# Patient Record
Sex: Male | Born: 1935 | Race: White | Hispanic: No | Smoking: Former smoker
Health system: Southern US, Community
[De-identification: ages and names within clinical notes are randomized; demographics above are authoritative.]

## PROBLEM LIST (undated history)

## (undated) DIAGNOSIS — R001 Bradycardia, unspecified: Secondary | ICD-10-CM

## (undated) DIAGNOSIS — G629 Polyneuropathy, unspecified: Secondary | ICD-10-CM

## (undated) DIAGNOSIS — I10 Essential (primary) hypertension: Secondary | ICD-10-CM

## (undated) DIAGNOSIS — E785 Hyperlipidemia, unspecified: Secondary | ICD-10-CM

## (undated) DIAGNOSIS — S065X9A Traumatic subdural hemorrhage with loss of consciousness of unspecified duration, initial encounter: Secondary | ICD-10-CM

## (undated) DIAGNOSIS — I509 Heart failure, unspecified: Secondary | ICD-10-CM

## (undated) DIAGNOSIS — I4821 Permanent atrial fibrillation: Secondary | ICD-10-CM

## (undated) DIAGNOSIS — S065XAA Traumatic subdural hemorrhage with loss of consciousness status unknown, initial encounter: Secondary | ICD-10-CM

## (undated) HISTORY — DX: Essential (primary) hypertension: I10

## (undated) HISTORY — PX: CRANIOTOMY: SHX93

## (undated) HISTORY — DX: Hyperlipidemia, unspecified: E78.5

## (undated) HISTORY — DX: Heart failure, unspecified: I50.9

## (undated) HISTORY — PX: CHOLECYSTECTOMY: SHX55

## (undated) HISTORY — DX: Traumatic subdural hemorrhage with loss of consciousness of unspecified duration, initial encounter: S06.5X9A

## (undated) HISTORY — DX: Bradycardia, unspecified: R00.1

## (undated) HISTORY — DX: Permanent atrial fibrillation: I48.21

## (undated) HISTORY — DX: Polyneuropathy, unspecified: G62.9

## (undated) HISTORY — DX: Traumatic subdural hemorrhage with loss of consciousness status unknown, initial encounter: S06.5XAA

---

## 2001-07-11 ENCOUNTER — Encounter: Payer: Self-pay | Admitting: Neurosurgery

## 2001-07-12 ENCOUNTER — Inpatient Hospital Stay (HOSPITAL_COMMUNITY): Admission: RE | Admit: 2001-07-12 | Discharge: 2001-07-14 | Payer: Self-pay | Admitting: Neurosurgery

## 2001-07-21 ENCOUNTER — Encounter: Admission: RE | Admit: 2001-07-21 | Discharge: 2001-07-21 | Payer: Self-pay | Admitting: Neurosurgery

## 2001-07-21 ENCOUNTER — Encounter: Payer: Self-pay | Admitting: Neurosurgery

## 2003-02-28 ENCOUNTER — Other Ambulatory Visit: Admission: RE | Admit: 2003-02-28 | Discharge: 2003-02-28 | Payer: Self-pay | Admitting: Dermatology

## 2004-09-01 ENCOUNTER — Ambulatory Visit: Payer: Self-pay | Admitting: Cardiology

## 2004-09-24 ENCOUNTER — Ambulatory Visit: Payer: Self-pay | Admitting: Cardiology

## 2004-09-29 ENCOUNTER — Ambulatory Visit: Payer: Self-pay | Admitting: Cardiology

## 2005-07-06 ENCOUNTER — Encounter: Payer: Self-pay | Admitting: Cardiology

## 2005-07-06 ENCOUNTER — Ambulatory Visit: Payer: Self-pay | Admitting: Cardiology

## 2005-07-07 ENCOUNTER — Encounter: Payer: Self-pay | Admitting: Cardiology

## 2005-07-21 ENCOUNTER — Ambulatory Visit: Payer: Self-pay | Admitting: Cardiology

## 2005-12-24 ENCOUNTER — Other Ambulatory Visit: Admission: RE | Admit: 2005-12-24 | Discharge: 2005-12-24 | Payer: Self-pay | Admitting: Dermatology

## 2007-03-30 ENCOUNTER — Ambulatory Visit (HOSPITAL_COMMUNITY): Admission: RE | Admit: 2007-03-30 | Discharge: 2007-03-30 | Payer: Self-pay | Admitting: Ophthalmology

## 2008-03-05 ENCOUNTER — Encounter: Payer: Self-pay | Admitting: Cardiology

## 2009-06-17 ENCOUNTER — Encounter: Payer: Self-pay | Admitting: Cardiology

## 2009-07-11 ENCOUNTER — Encounter: Payer: Self-pay | Admitting: Cardiology

## 2009-07-26 ENCOUNTER — Encounter: Payer: Self-pay | Admitting: Cardiology

## 2009-07-28 ENCOUNTER — Encounter: Payer: Self-pay | Admitting: Cardiology

## 2009-07-29 ENCOUNTER — Encounter: Payer: Self-pay | Admitting: Cardiology

## 2009-08-13 ENCOUNTER — Encounter: Payer: Self-pay | Admitting: Cardiology

## 2009-08-19 ENCOUNTER — Encounter: Payer: Self-pay | Admitting: Cardiology

## 2009-10-24 DIAGNOSIS — I498 Other specified cardiac arrhythmias: Secondary | ICD-10-CM

## 2009-10-24 DIAGNOSIS — E871 Hypo-osmolality and hyponatremia: Secondary | ICD-10-CM | POA: Insufficient documentation

## 2009-10-24 DIAGNOSIS — R55 Syncope and collapse: Secondary | ICD-10-CM

## 2009-10-24 DIAGNOSIS — I10 Essential (primary) hypertension: Secondary | ICD-10-CM | POA: Insufficient documentation

## 2009-10-24 DIAGNOSIS — Z8673 Personal history of transient ischemic attack (TIA), and cerebral infarction without residual deficits: Secondary | ICD-10-CM

## 2009-10-24 DIAGNOSIS — E785 Hyperlipidemia, unspecified: Secondary | ICD-10-CM

## 2009-10-24 DIAGNOSIS — E876 Hypokalemia: Secondary | ICD-10-CM | POA: Insufficient documentation

## 2009-10-24 DIAGNOSIS — I4891 Unspecified atrial fibrillation: Secondary | ICD-10-CM

## 2009-10-28 ENCOUNTER — Ambulatory Visit: Payer: Self-pay | Admitting: Cardiology

## 2009-10-28 DIAGNOSIS — R9430 Abnormal result of cardiovascular function study, unspecified: Secondary | ICD-10-CM | POA: Insufficient documentation

## 2009-11-04 ENCOUNTER — Encounter: Payer: Self-pay | Admitting: Cardiology

## 2009-11-04 ENCOUNTER — Encounter: Payer: Self-pay | Admitting: Physician Assistant

## 2009-11-13 ENCOUNTER — Encounter (INDEPENDENT_AMBULATORY_CARE_PROVIDER_SITE_OTHER): Payer: Self-pay | Admitting: *Deleted

## 2009-12-16 ENCOUNTER — Telehealth (INDEPENDENT_AMBULATORY_CARE_PROVIDER_SITE_OTHER): Payer: Self-pay | Admitting: *Deleted

## 2010-01-13 ENCOUNTER — Ambulatory Visit: Payer: Self-pay | Admitting: Cardiology

## 2010-01-13 ENCOUNTER — Encounter: Payer: Self-pay | Admitting: Cardiology

## 2010-01-28 ENCOUNTER — Ambulatory Visit: Payer: Self-pay | Admitting: Cardiology

## 2010-07-26 HISTORY — PX: PACEMAKER INSERTION: SHX728

## 2010-08-04 ENCOUNTER — Encounter: Payer: Self-pay | Admitting: Physician Assistant

## 2010-08-05 ENCOUNTER — Encounter: Payer: Self-pay | Admitting: Physician Assistant

## 2010-08-08 ENCOUNTER — Ambulatory Visit: Payer: Self-pay | Admitting: Physician Assistant

## 2010-08-08 ENCOUNTER — Encounter (INDEPENDENT_AMBULATORY_CARE_PROVIDER_SITE_OTHER): Payer: Self-pay | Admitting: *Deleted

## 2010-08-08 DIAGNOSIS — I080 Rheumatic disorders of both mitral and aortic valves: Secondary | ICD-10-CM | POA: Insufficient documentation

## 2010-08-08 DIAGNOSIS — Z8669 Personal history of other diseases of the nervous system and sense organs: Secondary | ICD-10-CM

## 2010-08-11 ENCOUNTER — Inpatient Hospital Stay (HOSPITAL_COMMUNITY): Admission: RE | Admit: 2010-08-11 | Discharge: 2010-08-12 | Payer: Self-pay | Admitting: Internal Medicine

## 2010-08-11 ENCOUNTER — Ambulatory Visit: Payer: Self-pay | Admitting: Internal Medicine

## 2010-08-12 ENCOUNTER — Telehealth (INDEPENDENT_AMBULATORY_CARE_PROVIDER_SITE_OTHER): Payer: Self-pay | Admitting: *Deleted

## 2010-08-13 ENCOUNTER — Encounter: Payer: Self-pay | Admitting: Internal Medicine

## 2010-08-21 ENCOUNTER — Ambulatory Visit: Payer: Self-pay

## 2010-08-21 ENCOUNTER — Encounter: Payer: Self-pay | Admitting: Internal Medicine

## 2010-11-25 NOTE — Assessment & Plan Note (Signed)
Summary: 3 mo fu per april reminder-srs   Visit Type:  Follow-up Primary Provider:  Kirstie Peri  CC:  atrial fibrillation.  History of Present Illness: The patient presents for followup of atrial fibrillation.  At the last visit I ordered an echocardiogram to evaluate the suggestion from a previous echo of a reduced ejection fraction of 40%. Our echo demonstrated some moderate pulmonary hypertension but well preserved chamber sizes and ventricular function with abnormal left ventricular EF. A Holter demonstrated persistent atrial fibrillation with a controlled rate and occasional PVC or short runs of nonsustained V. tach. There was one pause of 2.8 seconds but no sustained bradycardia arrhythmias and no symptoms.  Preventive Screening-Counseling & Management  Alcohol-Tobacco     Smoking Status: quit  Comments: Pt states for about 30 yrs he smoked only socially-when playing gold, party, etc.  Current Medications (verified): 1)  Furosemide 40 Mg Tabs (Furosemide) .... Take 1 Tablet By Mouth Once A Day 2)  Neurontin 100 Mg Caps (Gabapentin) .... Take 1 Tablet By Mouth Three Times A Day 3)  Diovan Hct 320-25 Mg Tabs (Valsartan-Hydrochlorothiazide) .... Take 1 Tablet By Mouth Once A Day 4)  Potassium Chloride Crys Cr 20 Meq Cr-Tabs (Potassium Chloride Crys Cr) .... Take 1 Tablet By Mouth Once A Day. Pt States He Takes About 2-3 Times Per Week. 5)  Aspirin Ec 325 Mg Tbec (Aspirin) .... Take 1/2 Tablet By Mouth Daily 6)  Flomax 0.4 Mg Caps (Tamsulosin Hcl) .... Take 1 Capsule By Mouth Once A Day  Allergies: No Known Drug Allergies  Comments:  Nurse/Medical Assistant: The patient's medications were reviewed with the patient and were updated in the Medication List. Pt verbally confirmed medications.  Cyril Loosen, RN, BSN (January 28, 2010 9:55 AM)  Past History:  Past Medical History: Reviewed history from 10/28/2009 and no changes required. Hypertension Permanent atrial  fibrillation Hyperlipidemia Hypoptassemia Subdural Hematoma CHF Peripheral Neuropathy  Past Surgical History: Reviewed history from 10/28/2009 and no changes required. Craniotomy Cholecystectomy  Social History: Smoking Status:  quit  Review of Systems       As stated in the HPI and negative for all other systems.   Vital Signs:  Patient profile:   77 year old male Height:      69 inches Weight:      193 pounds Pulse rate:   63 / minute BP sitting:   142 / 88  (left arm) Cuff size:   regular  Vitals Entered By: Cyril Loosen, RN, BSN (January 28, 2010 9:52 AM) CC: atrial fibrillation Comments Follow up visit. No cardiac complaints   Physical Exam  General:  Well developed, well nourished, in no acute distress. Neck:  Neck supple, no JVD. No masses, thyromegaly or abnormal cervical nodes. Lungs:  Clear bilaterally to auscultation and percussion. Heart:  Non-displaced PMI, chest non-tender; irregular rate and rhythm, S1, S2 without murmurs, rubs or gallops. Carotid upstroke normal, no bruit. Normal abdominal aortic size, no bruits. Femorals normal pulses, no bruits. Pedals normal pulses. No edema, no varicosities. Abdomen:  Bowel sounds positive; abdomen soft and non-tender without masses, organomegaly, or hernias noted. No hepatosplenomegaly, obese Msk:  Back normal, normal gait. Muscle strength and tone normal. Extremities:  No clubbing or cyanosis. Neurologic:  Alert and oriented x 3. Psych:  Normal affect.   Impression & Recommendations:  Problem # 1:  ATRIAL FIBRILLATION (ICD-427.31) He tolerates this rhythm. He has rate control. As discussed extensively in the previous note he is not  a Coumadin candidate. He accepts the risk of thromboembolic events and atrial fibrillation. His updated medication list for this problem includes:    Aspirin Ec 325 Mg Tbec (Aspirin) .Marland Kitchen... Take 1/2 tablet by mouth daily  Problem # 2:  HYPERTENSION (ICD-401.9) His blood pressure  is very slightly elevated. This can be followed by his primary M.D. He should have weight loss as the next therapeutic step in achieving target blood pressures.  Problem # 3:  CARDIOVASCULAR FUNCTION STUDY, ABNORMAL (ICD-794.30) His EF was normal. He does have elelvated pulmonary pressures but no symptoms or signs associated with this.  No further therapy or evaluation is planned.  Patient Instructions: 1)  Your physician recommends that you continue on your current medications as directed. Please refer to the Current Medication list given to you today. 2)  Your physician wants you to follow-up in: 18 months. You will receive a reminder letter in the mail two months in advance. If you don't receive a letter, please call our office to schedule the follow-up appointment.

## 2010-11-25 NOTE — Progress Notes (Signed)
Summary: echo?  Phone Note Call from Patient Call back at Work Phone [phone]   Caller: patient walked in Reason for Call: Talk to Nurse Summary of Call: patient is thinking that he is to have an echo scheduled.  He stated he has been waiting on [place] to contact him to schedule.  best number to reach him is work Initial call taken by: Claudette Laws,  December 16, 2009 3:32 PM  Follow-up for Phone Call        left message with Sallye Ober to have patient call our office. Follow-up by: Carlye Grippe,  December 18, 2009 10:28 AM  Additional Follow-up for Phone Call Additional follow up Details #1::        spoke with patient and informed him of his upcoming appt on April 5th and that his echo is due at that time also and he would get a call from [place] informing him of that information. Additional Follow-up by: Carlye Grippe,  December 19, 2009 2:20 PM

## 2010-11-25 NOTE — Miscellaneous (Signed)
Summary: Device preload  Clinical Lists Changes  Observations: Added new observation of PPM INDICATN: Brady A-fib (08/13/2010 14:52) Added new observation of MAGNET RTE: BOL 100 ERI 85 (08/13/2010 14:52) Added new observation of PPMLEADSTAT1: active (08/13/2010 14:52) Added new observation of PPMLEADSER1: [id number] (08/13/2010 14:52) Added new observation of PPMLEADMOD1: 1946  (08/13/2010 14:52) Added new observation of PPMLEADLOC1: RV  (08/13/2010 14:52) Added new observation of PPM IMP MD: Hillis Range, MD  (08/13/2010 14:52) Added new observation of PPMLEADDOI1: 08/11/2010  (08/13/2010 14:52) Added new observation of PPM DOI: 08/11/2010  (08/13/2010 14:52) Added new observation of PPM SERL#: [id number]  (08/13/2010 14:52) Added new observation of PPM MODL#: [id number]  (08/13/2010 56:21) Added new observation of PACEMAKERMFG: [employer]  (08/13/2010 14:52) Added new observation of PACEMAKER MD: Hillis Range, MD  (08/13/2010 14:52)      PPM Specifications Following MD:  Hillis Range, MD     PPM Vendor:  [employer] Jude     PPM Model Number:  [id number]     PPM Serial Number:  [id number] PPM DOI:  08/11/2010     PPM Implanting MD:  Hillis Range, MD  Lead 1    Location: RV     DOI: 08/11/2010     Model #: 1946     Serial #: [id number]     Status: active  Magnet Response Rate:  BOL 100 ERI 85  Indications:  Edwin [name]

## 2010-11-25 NOTE — Progress Notes (Signed)
Summary: Fax machine Down  ---- Converted from flag ---- ---- 08/12/2010 10:02 AM, Fax Status wrote: Called phone number ringing but no answer.  Transmit Time: 08/12/2010 10:00AM.  Details: Office Visit : rov-per shah/gene possible device needed -Document ID: [id number]-.  Fax ID: [id number]. ------------------------------  Per Dennie Bible at [facility], fax machine is down and has been for a while. She is aware we are unable to fax office notes d/t their fax being down.

## 2010-11-25 NOTE — Procedures (Signed)
Summary: Holter and Event/ CARDIONET DR. SHAH  Holter and Event/ [facility]   Imported By: Dorise Hiss 08/08/2010 10:23:36  _____________________________________________________________________  External Attachment:    Type:   Image     Comment:   External Document

## 2010-11-25 NOTE — Letter (Signed)
Summary: Implantable Device Instructions  Architectural technologist at [facility]. [address]   [place], [place] [address]   Phone: [phone]  Fax: [phone]      Implantable Device Instructions  You are scheduled for:  __X___ Permanent Transvenous Pacemaker _____ Implantable Cardioverter Defibrillator _____ Implantable Loop Recorder _____ Generator Change  on _Monday, October 17, 2011____ with Dr. __Allred___.  1.  Please arrive at the Short Stay Center at [facility] at _10:30am____ on the day of your procedure.  2.  Do not eat or drink the night before your procedure.  3.  Complete lab work on _____.  The lab at [facility] is open from 8:30 AM to 1:30 PM and from 2:30 PM to 5:00 PM.  The lab at [facility] is open from 7:30 AM to 5:30 PM.  You do not have to be fasting.  4.  Do NOT take these medications for ____ days prior to your procedure:  _________________________.  Take your last dose of Coumadin on ________.  5.  Plan for an overnight stay.  Bring your insurance cards and a list of your medications.  6.  Wash your chest and neck with antibacterial soap (any brand) the evening before and the morning of your procedure.  Rinse well.  7.  Education material received:     Pacemaker _____           ICD _____           Arrhythmia _____  *If you have ANY questions after you get home, please call the office [phone].  *Every attempt is made to prevent procedures from being rescheduled.  Due to the nauture of Electrophysiology, rescheduling can happen.  The physician is always aware and directs the staff when this occurs.

## 2010-11-25 NOTE — Progress Notes (Signed)
Summary: Fax not working  ---- [employer] from [employer] ---- ---- 08/12/2010 10:01 AM, Fax Status wrote: Called phone number ringing but no answer.  Transmit Time: 08/12/2010 9:59AM.  Details: Operative Report : NAME:  [name], Edwin             ACCOUNT NO.:  [id number] -Document ID: [id number]-.  Fax ID: [id number]. ------------------------------  Per Dennie Bible at [facility], fax machine is down and has been for a while. She is aware we are unable to fax office notes d/t their fax being down.

## 2010-11-25 NOTE — Procedures (Signed)
Summary: Holter Monitor  Holter Monitor   Imported By: Cyril Loosen, RN, BSN 11/13/2009 16:57:13  _____________________________________________________________________  External Attachment:    Type:   Image     Comment:   External Document

## 2010-11-25 NOTE — Assessment & Plan Note (Signed)
Summary: rov-per shah/gene possible device needed   Visit Type:  Follow-up Primary Provider:  Kirstie Peri  CC:  A.fib with low HR's.  History of Present Illness: patient is seen as an add-on today, per Dr. Margaretmary Eddy request, for evaluation of marked bradycardia.  Patient has been seen here in the recent past, by Dr. Antoine Poche, initially for evaluation of abnormal LV function, as well as atrial fibrillation. He has been previously deemed as being not a suitable candidate for Coumadin anticoagulation, given history of subdural hematomas. He has no known history of CAD, with a NL Adenosine Cardiolite: EF 59%, in 2006.  More recently, a repeat 2D echo in March 2011, per Dr. Antoine Poche, suggested NL LVF (EF 60-65%), with no focal wall motion abnormalities, mild MR, and moderate pulmonary HTN (RVSP 46 mmHg).  Patient reports new development of low pulse readings, in the 30 bpm range, with some associated weakness, but no near-syncope/syncope or falls. He states that he had previously had pulse readings in the 50s, "all my life".  Patient continues to exercise on a regular basis walking a mile a day, some 2-3 times a week.  Dr. Sherryll Burger initiated workup with a 2-D echo and 24-hour Holter monitor, earlier this week. Holter monitor yielded atrial fibrillation with rates ranging from 31-64 (mean 40 bpm). 2-D echo indicated normal LVEF (ES 60-65%) with mild/moderate mitral regurgitation.  Preventive Screening-Counseling & Management  Alcohol-Tobacco     Smoking Status: quit  Comments: Smoked socially about 15 yrs ago  Current Medications (verified): 1)  Furosemide 40 Mg Tabs (Furosemide) .... Take 1 1/2 Tablet By Mouth Once A Day 2)  Diovan Hct 320-25 Mg Tabs (Valsartan-Hydrochlorothiazide) .... Take 1 Tablet By Mouth Once A Day 3)  Potassium Chloride Crys Cr 20 Meq Cr-Tabs (Potassium Chloride Crys Cr) .... Take 1 Tablet By Mouth Once A Day. Pt States He Takes About 2-3 Times Per Week. 4)  Aspirin Ec 325  Mg Tbec (Aspirin) .... Take 1/2 Tablet By Mouth Daily 5)  Flomax 0.4 Mg Caps (Tamsulosin Hcl) .... Take 1 Capsule By Mouth Once A Day 6)  Gabapentin 300 Mg Caps (Gabapentin) .... Take 1 Capsule By Mouth Two Times A Day  Allergies: No Known Drug Allergies  Comments:  Nurse/Medical Assistant: The patient's medications were reviewed with the patient and were updated in the Medication List. Pt verbally confirmed medications.  Cyril Loosen, RN, BSN (August 08, 2010 2:50 PM)  Past History:  Past Medical History: Last updated: 10/28/2009 Hypertension Permanent atrial fibrillation Hyperlipidemia Hypoptassemia Subdural Hematoma CHF Peripheral Neuropathy  Review of Systems       No fevers, chills, hemoptysis, dysphagia, melena, hematocheezia, hematuria, rash, claudication, orthopnea, pnd, pedal edema. denies tachycardia palpitations, exertional chest discomfort, or significant exertional dyspnea. All other systems negative.   Vital Signs:  Patient profile:   78 year old male Height:      69 inches Weight:      197.25 pounds BMI:     29.23 Pulse rate:   42 / minute BP sitting:   161 / 87  (left arm) Cuff size:   regular  Vitals Entered By: Cyril Loosen, RN, BSN (August 08, 2010 2:45 PM) CC: A.fib with low HR's Comments Per Dr. Sherryll Burger for eval for possible PTVP   Physical Exam  Additional Exam:  GEN: 77 year old male, sitting upright, no distress HEENT: NCAT,PERRLA,EOMI NECK: palpable pulses, no bruits; no JVD; no TM LUNGS: CTA bilaterally HEART: irregularly irregular (S1S2); no significant murmurs; no rubs; no gallops ABD:  soft, NT; intact BS EXT: intact distal pulses; 1+ bilateral peripheral edema SKIN: warm, dry MUSC: no obvious deformity NEURO: A/O (x3)     EKG  Procedure date:  08/04/2010  Findings:      EKG from Dr. Alver Fisher office, 08/04/10: idio junctional rhythm 40 bpm  Impression & Recommendations:  Problem # 1:  ATRIAL FIBRILLATION  (ICD-427.31)  patient presents with symptomatic bradycardia, with previously documented atrial fibrillation, but now with marked bradycardia in the 30-40 bpm range. There is also evidence of a junctional escape rhythm at 40 bpm. He presents with complaint of weakness, but no syncope or falls. He has no documented history of CAD, with a normal nuclear imaging study in 2006. More recently, he had a 2-D echo indicating normal LVF. Patient has been previously deemed not a suitable candidate for Coumadin anticoagulation, given history of subdural hematoma. He is on aspirin. Recommendation, per Dr. Johney Frame, is to proceed with permanent pacemaker implantation, and patient has agreed to proceed. Risks/benefits of the procedure were discussed, and arrangements have been made to have this scheduled for Monday of next week. Labs will be drawn on the morning of the procedure.  Problem # 2:  HYPERTENSION (ICD-401.9) Assessment: Comment Only  His updated medication list for this problem includes:    Furosemide 40 Mg Tabs (Furosemide) .Marland Kitchen... Take 1 1/2 tablet by mouth once a day    Diovan Hct 320-25 Mg Tabs (Valsartan-hydrochlorothiazide) .Marland Kitchen... Take 1 tablet by mouth once a day    Aspirin Ec 325 Mg Tbec (Aspirin) .Marland Kitchen... Take 1/2 tablet by mouth daily  Problem # 3:  MITRAL REGURGITATION (ICD-396.3)  assessed as mild/moderate, by most recent studies. we'll continue to monitor clinically.  Problem # 4:  SUBDURAL HEMATOMA, HX OF (ICD-V12.49) Assessment: Comment Only  Appended Document: rov-per shah/gene possible device needed The patient has symptomatic bradycardia.  Given regularization of RR intervals, I am concerned about degeneration of the AV node.  His QRS remains narrow which is somewhat reassuring.  He denies presyncope or syncope and has not had prolonged pauses.  No reversible cause has been found.    I had a long discussion with the patient today about my concerns.  I have advised him to have a pacemaker  implanted at our next available time and to avoid driving in the interim.  He is instructed to call 911 immediately if he has symptoms of bradycardia including dizziness or presyncope.  Risks, benefits, alternatives to PPM implantation were discussed in detail with the patient today.  He understands that the risks include but are not limited to bleeding, infection, pneumothorax, perforation, tamponade, vascular damage, renal failure, MI, stroke, death, and lead dislodgement.  He accepets these risks and wishes to proceed.  We will plan single chamber PPM at the next available time.

## 2010-11-25 NOTE — Letter (Signed)
Summary: Engineer, materials at [facility]  [address]   [place], [place] [address]   Phone: [phone]  Fax: [phone]        November 13, 2009 MRN: [id number]    Edwin [name] [address] [place], [place]  [address]    Dear Mr. FABRO,  Your test ordered by Selena Batten has been reviewed by your physician (or physician assistant) and was found to be stable. Your physician (or physician assistant) felt no changes were needed at this time.  ____ Echocardiogram  ____ Cardiac Stress Test  ____ Lab Work  ____ Peripheral vascular study of arms, legs or neck  ____ CT scan or X-ray  ____ Lung or Breathing test  __X__ Other: Holter Monitor-   Thank you.   Cyril Loosen, RN, BSN    Duane Boston, M.D., F.A.C.C. Thressa Sheller, M.D., F.A.C.C. Oneal Grout, M.D., F.A.C.C. Cheree Ditto, M.D., F.A.C.C. Daiva Nakayama, M.D., F.A.C.C. Kenney Houseman, M.D., F.A.C.C. Jeanne Ivan, PA-C

## 2010-11-25 NOTE — Cardiovascular Report (Signed)
Summary: Office Visit   Office Visit   Imported By: Roderic Ovens 08/25/2010 16:02:57  _____________________________________________________________________  External Attachment:    Type:   Image     Comment:   External Document

## 2010-11-25 NOTE — Procedures (Signed)
Summary: wound check.sjm.amber   Current Medications (verified): 1)  Furosemide 40 Mg Tabs (Furosemide) .... One By Mouth Daily 2)  Diovan Hct 320-25 Mg Tabs (Valsartan-Hydrochlorothiazide) .... Take 1 Tablet By Mouth Once A Day 3)  Potassium Chloride Crys Cr 20 Meq Cr-Tabs (Potassium Chloride Crys Cr) .... Take 1 Tablet By Mouth Once A Day. Pt States He Takes About 2-3 Times Per Week. 4)  Aspirin Ec 325 Mg Tbec (Aspirin) .... Take 1/2 Tablet By Mouth Daily 5)  Flomax 0.4 Mg Caps (Tamsulosin Hcl) .... Take 1 Capsule By Mouth Once A Day 6)  Gabapentin 300 Mg Caps (Gabapentin) .... Take 1 Capsule By Mouth Two Times A Day  Allergies (verified): No Known Drug Allergies  PPM Specifications Following MD:  Hillis Range, MD     PPM Vendor:  [employer]     PPM Model Number:  [id number]     PPM Serial Number:  [id number] PPM DOI:  08/11/2010     PPM Implanting MD:  Hillis Range, MD  Lead 1    Location: RV     DOI: 08/11/2010     Model #: 1946     Serial #: [id number]     Status: active  Magnet Response Rate:  BOL 100 ERI 85  Indications:  Edwin [name]   PPM Follow Up Remote Check?  No Battery Voltage:  3.08 V     Battery Est. Longevity:  12.7years     Pacer Dependent:  Yes     Right Ventricle  Amplitude: 11.2 mV, Impedance: 780 ohms, Threshold: 0.375 V at 0.4 msec  Episodes Ventricular Pacing:  100%  Parameters Mode:  VVIR     Lower Rate Limit:  60     Upper Rate Limit:  110 Next Cardiology Appt Due:  12/03/2010 Tech Comments:  Steri strips removed, no redness or edema noted.  Device function normal.  Ventricular auto capture on today.  ROV 2/12 with Dr. Johney Frame in [facility], LPN  August 21, 2010 3:35 PM

## 2010-11-25 NOTE — Procedures (Signed)
Summary: Holter and Event/ [facility] MONITOR REPORT  Holter and Event/ [facility]ARD[facility]ET MONITOR REPORT   Imported By: Dorise Hiss 11/04/2009 14:35:40  _____________________________________________________________________  External Attachment:    Type:   Image     Comment:   External Document  Appended Document: Holter and Event/ CARDIONET MONITOR REPORT Pt notified of results by letter.

## 2010-11-25 NOTE — Assessment & Plan Note (Signed)
Summary: EST PATIENT - RECENT ECHO   Visit Type:  Follow-up Primary Provider:  Kirstie Peri  CC:  Atrial Fibrillation and Abnormal Echo.  History of Present Illness: The patient presents for evaluation of abnormal echocardiogram.  He has had a long history of atrial fibrillation. He has not been deemed a Coumadin candidate because of a previous history of subdural hematomas. Recently he had an echocardiogram for followup and was noted to have an EF of approximately 40%. I do not have access to these images. The report does mention poor acoustic windows. A previous echocardiogram in 2009 did not suggest any reduced ejection fraction.  The patient is quite a well looking 77 year old. He does not report any dyspnea he climbs stairs daily in his house. He does not describe resting shortness of breath and has no PND or orthopnea. He doesn't report any palpitations and has had no presyncope or syncope. He has had no chest pressure, neck or arm discomfort. He does report problems with his gait and balance but he has not had recent falls. He did have a fall leading to a subdural hematoma several years ago.  Preventive Screening-Counseling & Management  Alcohol-Tobacco     Smoking Status: quit < 6 months  Comments: Pt states smoked casually for about 15-20 yrs but quit about 15 yrs ago.  Current Medications (verified): 1)  Furosemide 40 Mg Tabs (Furosemide) .... Take 1-1 1/2 Tablet By Mouth Once A Day 2)  Neurontin 100 Mg Caps (Gabapentin) .... Take 1 Tablet By Mouth Three Times A Day 3)  Diovan Hct 320-25 Mg Tabs (Valsartan-Hydrochlorothiazide) .... Take 1 Tablet By Mouth Once A Day 4)  Potassium Chloride Crys Cr 20 Meq Cr-Tabs (Potassium Chloride Crys Cr) .... Take 1 Tablet By Mouth Once A Day. Pt States He Takes About 2-3 Times Per Week. 5)  Aspirin Ec 325 Mg Tbec (Aspirin) .... Take 1/2 Tablet By Mouth Daily 6)  Flomax 0.4 Mg Caps (Tamsulosin Hcl) .... Take 1 Capsule By Mouth Once A  Day  Allergies (verified): No Known Drug Allergies  Comments:  Nurse/Medical Assistant: The patient's medications were reviewed with the patient and were updated in the Medication List. Pt did not bring list or medication bottles but verbally confirmed medications.  Past History:  Past Medical History: Hypertension Permanent atrial fibrillation Hyperlipidemia Hypoptassemia Subdural Hematoma CHF Peripheral Neuropathy  Past Surgical History: Craniotomy Cholecystectomy  Family History: Mother died of CHF Father died age 29 Typhoid  Social History: Retired  Widower, step son Tobacco Use - Former. Sporadic tobacco use. Alcohol Use - yes Smoking Status:  quit < 6 months  Review of Systems       As stated in the HPI and negative for all other systems.   Vital Signs:  Patient profile:   77 year old male Height:      69 inches Weight:      194.50 pounds Pulse rate:   64 / minute BP sitting:   132 / 84  (left arm) Cuff size:   regular  Vitals Entered By: Cyril Loosen, RN, BSN (October 28, 2009 2:28 PM) CC: Atrial Fibrillation, Abnormal Echo Comments Follow up appt.   Physical Exam  General:  Well developed, well nourished, in no acute distress. Head:  normocephalic and atraumatic Eyes:  PERRLA/EOM intact; conjunctiva and lids normal. Mouth:  Teeth, gums and palate normal. Oral mucosa normal. Neck:  Neck supple, no JVD. No masses, thyromegaly or abnormal cervical nodes. Chest Wall:  no deformities or  breast masses noted Lungs:  Clear bilaterally to auscultation and percussion. Abdomen:  Bowel sounds positive; abdomen soft and non-tender without masses, organomegaly, or hernias noted. No hepatosplenomegaly, obese Msk:  Back normal, normal gait. Muscle strength and tone normal. Extremities:  No clubbing or cyanosis. Neurologic:  Alert and oriented x 3. Skin:  Intact without lesions or rashes. Cervical Nodes:  no significant adenopathy Axillary Nodes:  no  significant adenopathy Inguinal Nodes:  no significant adenopathy Psych:  Normal affect.   Detailed Cardiovascular Exam  Neck    Carotids: Carotids full and equal bilaterally without bruits.      Neck Veins: Normal, no JVD.    Heart    Inspection: no deformities or lifts noted.      Palpation: normal PMI with no thrills palpable.      Auscultation: S1 and S2 within normal limits, irregular rhythm, no S3, no clicks, no rubs, no murmurs  Vascular    Abdominal Aorta: no palpable masses, pulsations, or audible bruits.      Femoral Pulses: normal femoral pulses bilaterally.      Pedal Pulses: normal pedal pulses bilaterally.      Radial Pulses: normal radial pulses bilaterally.      Peripheral Circulation: no clubbing, cyanosis, or edema noted with normal capillary refill.     EKG  Procedure date:  10/28/2009  Findings:      atrial fibrillation, left axis deviation, poor anterior R-wave progression, no acute ST-T wave changes.  Impression & Recommendations:  Problem # 1:  CARDIOVASCULAR FUNCTION STUDY, ABNORMAL (ICD-794.30) The the patient did have an abnormal echoand I reviewed these results. However, there was mention of poor acoustic windows. He has no symptoms. He is already on an ARB. At this point I will suggest a repeat echocardiogram in April 6 months from the date of the previous. We will do this here and hopefully we'll get better windows.  Further treatment will be based on this result.  Problem # 2:  ATRIAL FIBRILLATION (ICD-427.31) I had a long discussion with the patient about the risk benefit of Coumadin. Given his problems with gait, stability and previous subdural hematoma I would think the risk of Coumadin slightly outweighs the benefit. We will continue on aspirin alone. I did apply a holter monitor to make sure that he has reasonable rate control.  Orders: EKG w/ Interpretation (93000) 2-D Echocardiogram (2D Echo) Holter Monitor (Holter Monitor)  Problem #  3:  HYPERTENSION (ICD-401.9) His blood pressure is controlled and he will continue on the meds as listed.  Patient Instructions: 1)  Your physician has recommended that you wear a holter monitor.  Holter monitors are medical devices that record the heart's electrical activity. Doctors most often use these monitors to diagnose arrhythmias. Arrhythmias are problems with the speed or rhythm of the heartbeat. The monitor is a small, portable device. You can wear one while you do your normal daily activities. This is usually used to diagnose what is causing palpitations/syncope (passing out). 2)  Your physician has requested that you have an echocardiogram.  Echocardiography is a painless test that uses sound waves to create images of your heart. It provides your doctor with information about the size and shape of your heart and how well your heart's chambers and valves are working.  This procedure takes approximately one hour. There are no restrictions for this procedure. Your  test will be scheduled for April/2011. 3)  Your physician wants you to follow-up in: 3months.  You will receive  a reminder letter in the mail about  two months in advance. If you don't receive a letter, please call our office to schedule the follow-up appointment.

## 2010-12-03 ENCOUNTER — Encounter (INDEPENDENT_AMBULATORY_CARE_PROVIDER_SITE_OTHER): Payer: Medicare Other | Admitting: Internal Medicine

## 2010-12-03 ENCOUNTER — Encounter: Payer: Self-pay | Admitting: Internal Medicine

## 2010-12-03 DIAGNOSIS — I10 Essential (primary) hypertension: Secondary | ICD-10-CM

## 2010-12-03 DIAGNOSIS — I495 Sick sinus syndrome: Secondary | ICD-10-CM | POA: Insufficient documentation

## 2010-12-03 DIAGNOSIS — I4891 Unspecified atrial fibrillation: Secondary | ICD-10-CM

## 2010-12-11 NOTE — Assessment & Plan Note (Signed)
Summary: PACER CHECK/LG PER AMBER   Visit Type:  Pacemaker check Primary Provider:  Kirstie Peri   History of Present Illness: The patient presents today for routine electrophysiology followup. He reports doing very well since having his pacemaker implanted. The patient denies symptoms of palpitations, chest pain, shortness of breath, orthopnea, PND, lower extremity edema, dizziness, presyncope, syncope, or neurologic sequela. The patient is tolerating medications without difficulties and is otherwise without complaint today.   Preventive Screening-Counseling & Management  Alcohol-Tobacco     Smoking Status: quit  Current Medications (verified): 1)  Furosemide 40 Mg Tabs (Furosemide) .... One By Mouth Daily 2)  Diovan Hct 320-25 Mg Tabs (Valsartan-Hydrochlorothiazide) .... Take 1 Tablet By Mouth Once A Day 3)  Potassium Chloride Crys Cr 20 Meq Cr-Tabs (Potassium Chloride Crys Cr) .... Take 1 Tablet By Mouth Once A Day. Pt States He Takes About 2-3 Times Per Week. 4)  Aspirin Ec 325 Mg Tbec (Aspirin) .... Take 1/2 Tablet By Mouth Daily 5)  Flomax 0.4 Mg Caps (Tamsulosin Hcl) .... Take 1 Capsule By Mouth Once A Day 6)  Gabapentin 300 Mg Caps (Gabapentin) .... Take 1 Capsule By Mouth Two Times A Day  Allergies (verified): No Known Drug Allergies  Comments:  Nurse/Medical Assistant: The patient's medications were reviewed with the patient and were updated in the Medication List. Tammi Romine CMA (December 03, 2010 2:22 PM)  Past History:  Past Medical History: Hypertension Permanent atrial fibrillation Hyperlipidemia Hypoptassemia Subdural Hematoma CHF Peripheral Neuropathy Bradycardia s/p PPM  Past Surgical History: Craniotomy Cholecystectomy PPM (SJM) by Fawn Kirk 10/11  Social History: Retired  Widower, step son Tobacco Use - Former. prior tobacco use. Alcohol Use - yes  Review of Systems       All systems are reviewed and negative except as listed in the HPI.    Vital Signs:  Patient profile:   77 year old male Height:      69 inches Weight:      199 pounds BMI:     29.49 Pulse rate:   73 / minute BP sitting:   147 / 85  (left arm) Cuff size:   regular  Vitals Entered By: Fuller Plan CMA (December 03, 2010 2:17 PM)  Physical Exam  General:  Well developed, well nourished, in no acute distress. Head:  normocephalic and atraumatic Eyes:  PERRLA/EOM intact; conjunctiva and lids normal. Mouth:  Teeth, gums and palate normal. Oral mucosa normal. Neck:  Neck supple, no JVD. No masses, thyromegaly or abnormal cervical nodes. Chest Wall:  pacemaker pocket is well healed Lungs:  Clear bilaterally to auscultation and percussion. Heart:  RRR (paced) Abdomen:  Bowel sounds positive; abdomen soft and non-tender without masses, organomegaly, or hernias noted. No hepatosplenomegaly, obese Msk:  Back normal, normal gait. Muscle strength and tone normal. Extremities:  No clubbing or cyanosis. Neurologic:  Alert and oriented x 3.   PPM Specifications Following MD:  Hillis Range, MD     PPM Vendor:  St Jude     PPM Model Number:  [id number]     PPM Serial Number:  [id number] PPM DOI:  08/11/2010     PPM Implanting MD:  Hillis Range, MD  Lead 1    Location: RV     DOI: 08/11/2010     Model #: [id number]     Serial #: [id number]     Status: active  Magnet Response Rate:  BOL 100 ERI 85  Indications:  Alphonzo Severance   PPM Follow  Up Battery Voltage:  2.99 V     Battery Est. Longevity:  13.3 yrs     Pacer Dependent:  Yes     Right Ventricle  Amplitude: 12.0 mV, Impedance: 750 ohms, Threshold: 0.375 V at 0.4 msec  Episodes MS Episodes:  0     Ventricular High Rate:  0     Ventricular Pacing:  98%  Parameters Mode:  VVIR     Lower Rate Limit:  60     Upper Rate Limit:  110 Next Cardiology Appt Due:  07/27/2011 Tech Comments:  NORMAL DEVICE FUNCTION. NO EPISODES SINCE LAST CHECK. NO CHANGES MADE. ROV IN OCT 2012 W/JA. Vella Kohler  December 03, 2010 2:45  PM MD Comments:  agree  Impression & Recommendations:  Problem # 1:  BRADYCARDIA-TACHYCARDIA SYNDROME (ICD-427.81) stable normal pacemaker function as above  Problem # 2:  ATRIAL FIBRILLATION (ICD-427.31) Patient has been previously deemed not a suitable candidate for Coumadin anticoagulation, given history of subdural hematoma. He is on aspirin and rate controlled  Problem # 3:  HYPERTENSION (ICD-401.9) stable salt restriction

## 2010-12-23 NOTE — Cardiovascular Report (Signed)
Summary: [facility]/ [facility]  [facility]/ [facility] SUMMARY   Imported By: Dorise Hiss 12/16/2010 15:02:44  _____________________________________________________________________  External Attachment:    Type:   Image     Comment:   External Document

## 2011-01-07 LAB — BASIC METABOLIC PANEL
Calcium: 8.5 mg/dL (ref 8.4–10.5)
Chloride: 94 mEq/L — ABNORMAL LOW (ref 96–112)
Creatinine, Ser: 0.91 mg/dL (ref 0.4–1.5)
GFR calc Af Amer: >60 mL/min (ref >60)
GFR calc non Af Amer: >60 mL/min (ref >60)
GFR calc non Af Amer: >60 mL/min (ref >60)
Glucose, Bld: 105 mg/dL — ABNORMAL HIGH (ref 70–99)
Potassium: 3.9 mEq/L (ref 3.5–5.1)
Sodium: 131 mEq/L — ABNORMAL LOW (ref 135–145)

## 2011-01-07 LAB — CBC
HCT: 35.6 % — ABNORMAL LOW (ref 39.0–52.0)
Hemoglobin: 12.2 g/dL — ABNORMAL LOW (ref 13.0–17.0)
MCHC: 34.3 g/dL (ref 30.0–36.0)
WBC: 8.1 10*3/uL (ref 4.0–10.5)

## 2011-01-07 LAB — APTT: aPTT: 32 seconds (ref 24–37)

## 2011-01-07 LAB — PROTIME-INR: INR: 1.26 (ref 0.00–1.49)

## 2011-01-07 LAB — SURGICAL PCR SCREEN: MRSA, PCR: NEGATIVE

## 2011-03-13 NOTE — Op Note (Signed)
[place]  Patient:    Edwin [name], Edwin [name] Visit Number: [id number] MRN: [id number]          Service Type: SUR Location: [facility] [id number] Attending Physician:  Donn Pierini Dictated by:   Julio Sicks, M.D. Proc. Date: 07/12/01 Admit Date:  07/12/2001                             Operative Report  PREOPERATIVE DIAGNOSIS:  Right chronic subdural hematoma, parietal.  POSTOPERATIVE DIAGNOSIS:  Right chronic subdural hematoma, parietal.  PROCEDURE:  Right parietal burr hole evacuation of chronic subdural hematoma.  SURGEON:  Julio Sicks, M.D.  ANESTHESIA:  General endotracheal.  INDICATIONS FOR PROCEDURE:  [name] is a 77 year old male who suffered a head injury approximately one month ago.  The patient did not seek medical attention.  He is felt unsteady since the time of his head injury. Approximately three days ago the patient had a transient episode of left upper extremity numbness and paresthesias.  MRI scanning demonstrated evidence of an extra-axial fluid collection consistent with a chronic subdural hematoma in his right parietal region.  This fluid collection caused significant mass effect.  The patient is counseled as to his options.  He has decided to proceed with burr hole evacuation of this chronic fluid collection.  DESCRIPTION OF PROCEDURE:  The patient was taken to the operating room and placed on the operating table in a supine position.  After an adequate level of anesthesia was achieved, the patient was positioned with his right shoulder elevated and his head turned towards the left.  The patients right parietal scalp was shaved and prepped sterilely.  A 10 blade was used to make two separate 3 cm skin incisions in the right parietal region.  Self-retaining retractors were placed.  Burr holes were made in both locations.  The dura was then coagulated and incised in a cruciated fashion.  Clear yellow fluid under pressure  was then evacuated from the subdural space.  All blood products were irrigated from the subdural space, and all irrigation emanating from the second hole was clear.  The brain re-expanded nicely.  It was decided not to place a drain in the subdural space because of how well the brain re-expanded. The patient does not have atrophy on his original MRI scan.  Gelfoam was placed over each burr hole site.  Scalp was reapproximated.  There were no complications.  The patient tolerated the procedure well, and he returns to the recovery room postoperatively. Dictated by:   Julio Sicks, M.D. Attending Physician:  Donn Pierini DD:  07/12/01 TD:  07/12/01 Job: [id number]

## 2011-08-04 ENCOUNTER — Encounter: Payer: Self-pay | Admitting: Internal Medicine

## 2011-08-07 ENCOUNTER — Encounter: Payer: Self-pay | Admitting: Internal Medicine

## 2011-08-07 ENCOUNTER — Ambulatory Visit (INDEPENDENT_AMBULATORY_CARE_PROVIDER_SITE_OTHER): Payer: Medicare Other | Admitting: Internal Medicine

## 2011-08-07 DIAGNOSIS — I4891 Unspecified atrial fibrillation: Secondary | ICD-10-CM

## 2011-08-07 DIAGNOSIS — I495 Sick sinus syndrome: Secondary | ICD-10-CM

## 2011-08-07 LAB — PACEMAKER DEVICE OBSERVATION
BRDY-0002RV: 60 {beats}/min
BRDY-0005RV: 50 {beats}/min
DEVICE MODEL PM: 7127228

## 2011-08-07 NOTE — Progress Notes (Signed)
The patient presents today for routine electrophysiology followup.   He remains very active. Since last being seen in our clinic, the patient reports doing very well.  Today, he denies symptoms of palpitations, chest pain, shortness of breath, orthopnea, PND, lower extremity edema, dizziness, presyncope, syncope, or neurologic sequela.  The patient feels that he is tolerating medications without difficulties and is otherwise without complaint today.   Past Medical History  Diagnosis Date  . Hypertension   . Hyperlipidemia   . CHF (congestive heart failure)   . Permanent atrial fibrillation   . Subdural hematoma   . Peripheral neuropathy   . Bradycardia     s/p SJM PPM by Dr Johney Frame   Past Surgical History  Procedure Date  . Pacemaker insertion 10/11    St Jude  . Craniotomy   . Cholecystectomy     Current Outpatient Prescriptions  Medication Sig Dispense Refill  . aspirin 325 MG EC tablet Take 162.5 mg by mouth daily.        . furosemide (LASIX) 40 MG tablet Take 40 mg by mouth 2 (two) times daily.        Marland Kitchen gabapentin (NEURONTIN) 300 MG capsule Take 300 mg by mouth 2 (two) times daily.        Marland Kitchen labetalol (NORMODYNE) 200 MG tablet Take 200 mg by mouth daily.        . potassium chloride (KLOR-CON) 20 MEQ packet Take 20 mEq by mouth daily. Pt states he takes 2-3 weekly       . Tamsulosin HCl (FLOMAX) 0.4 MG CAPS Take by mouth.        . valsartan (DIOVAN) 320 MG tablet Take 320 mg by mouth daily.          No Known Allergies  History   Social History  . Marital Status: Widowed    Spouse Name: N/A    Number of Children: 1  . Years of Education: N/A   Occupational History  . retired    Social History Main Topics  . Smoking status: Former Games developer  . Smokeless tobacco: Not on file  . Alcohol Use: Yes  . Drug Use: No  . Sexually Active: Not on file   Other Topics Concern  . Not on file   Social History Narrative   Owns/ operates a golf course.  Also active with real estate  investments.    Family History  Problem Relation Age of Onset  . Heart failure Mother   . Other Father 26    typhoid    Physical Exam: Filed Vitals:   08/07/11 1447  BP: 155/81  Pulse: 69  Height: 5' 9.5" (1.765 m)  Weight: 200 lb (90.719 kg)    GEN- The patient is well appearing, alert and oriented x 3 today.   Head- normocephalic, atraumatic Eyes-  Sclera clear, conjunctiva pink Ears- hearing intact Oropharynx- clear Neck- supple, no JVP Lymph- no cervical lymphadenopathy Lungs- Clear to ausculation bilaterally, normal work of breathing Chest- pacemaker pocket is well healed Heart- Regular rate and rhythm, no murmurs, rubs or gallops, PMI not laterally displaced GI- soft, NT, ND, + BS Extremities- no clubbing, cyanosis, or edema   Pacemaker interrogation- reviewed in detail today,  See PACEART report  Assessment and Plan:

## 2011-08-07 NOTE — Assessment & Plan Note (Signed)
Stable On ASA Not a coumadin candidate due to prior SDH

## 2011-08-07 NOTE — Assessment & Plan Note (Signed)
Normal pacemaker function See Pace Art report No changes today e

## 2011-11-05 ENCOUNTER — Encounter: Payer: Medicare Other | Admitting: *Deleted

## 2011-11-09 ENCOUNTER — Encounter: Payer: Self-pay | Admitting: *Deleted

## 2011-11-18 ENCOUNTER — Telehealth: Payer: Self-pay | Admitting: Internal Medicine

## 2011-11-18 NOTE — Telephone Encounter (Signed)
Instructions given to patient on how to send his missed transmission.  The patient understood and will send later this evening.

## 2011-11-18 NOTE — Telephone Encounter (Signed)
New msg: pt calling wanting to speak to [place]. Please return pt call to discuss further.

## 2011-12-17 ENCOUNTER — Ambulatory Visit (INDEPENDENT_AMBULATORY_CARE_PROVIDER_SITE_OTHER): Payer: Medicare Other | Admitting: *Deleted

## 2011-12-17 ENCOUNTER — Encounter: Payer: Self-pay | Admitting: Internal Medicine

## 2011-12-17 DIAGNOSIS — I495 Sick sinus syndrome: Secondary | ICD-10-CM

## 2011-12-17 DIAGNOSIS — I4891 Unspecified atrial fibrillation: Secondary | ICD-10-CM

## 2011-12-17 LAB — REMOTE PACEMAKER DEVICE
BMOD-0002RV: 12
BRDY-0004RV: 110 {beats}/min
RV LEAD AMPLITUDE: 5.4 mv
RV LEAD IMPEDENCE PM: 710 Ohm

## 2011-12-24 NOTE — Progress Notes (Signed)
Remote pacer check  

## 2012-01-01 ENCOUNTER — Encounter: Payer: Self-pay | Admitting: *Deleted

## 2012-01-29 ENCOUNTER — Telehealth: Payer: Self-pay | Admitting: Internal Medicine

## 2012-01-29 NOTE — Telephone Encounter (Signed)
Explained to pt about letter received in regards to next transmission. Pt understands and verified transmitter was hooked up thru telephone.

## 2012-01-29 NOTE — Telephone Encounter (Signed)
DOESN'T  UNDERSTAND LETTER HE RECVD IN REFERENCE TO HIS TRANSMISSION.

## 2012-03-17 ENCOUNTER — Ambulatory Visit (INDEPENDENT_AMBULATORY_CARE_PROVIDER_SITE_OTHER): Payer: Medicare Other | Admitting: *Deleted

## 2012-03-17 ENCOUNTER — Encounter: Payer: Self-pay | Admitting: Internal Medicine

## 2012-03-17 DIAGNOSIS — I4891 Unspecified atrial fibrillation: Secondary | ICD-10-CM

## 2012-03-17 DIAGNOSIS — Z950 Presence of cardiac pacemaker: Secondary | ICD-10-CM

## 2012-03-18 ENCOUNTER — Encounter: Payer: Self-pay | Admitting: *Deleted

## 2012-03-18 DIAGNOSIS — Z950 Presence of cardiac pacemaker: Secondary | ICD-10-CM | POA: Insufficient documentation

## 2012-03-18 LAB — REMOTE PACEMAKER DEVICE
BMOD-0002RV: 12
BRDY-0002RV: 60 {beats}/min
BRDY-0004RV: 110 {beats}/min
DEVICE MODEL PM: 7127228
RV LEAD IMPEDENCE PM: 690 Ohm

## 2012-04-08 ENCOUNTER — Encounter: Payer: Self-pay | Admitting: *Deleted

## 2012-06-23 ENCOUNTER — Ambulatory Visit (INDEPENDENT_AMBULATORY_CARE_PROVIDER_SITE_OTHER): Payer: Medicare Other | Admitting: *Deleted

## 2012-06-23 ENCOUNTER — Encounter: Payer: Self-pay | Admitting: Internal Medicine

## 2012-06-23 DIAGNOSIS — I4891 Unspecified atrial fibrillation: Secondary | ICD-10-CM

## 2012-06-23 DIAGNOSIS — Z950 Presence of cardiac pacemaker: Secondary | ICD-10-CM

## 2012-06-23 LAB — REMOTE PACEMAKER DEVICE
BRDY-0004RV: 110 {beats}/min
DEVICE MODEL PM: 7127228
RV LEAD AMPLITUDE: 12 mv
RV LEAD THRESHOLD: 0.625 V

## 2012-06-28 ENCOUNTER — Encounter: Payer: Self-pay | Admitting: *Deleted

## 2012-08-29 ENCOUNTER — Encounter: Payer: Medicare Other | Admitting: Internal Medicine

## 2012-10-12 ENCOUNTER — Encounter: Payer: Self-pay | Admitting: Internal Medicine

## 2012-10-12 ENCOUNTER — Ambulatory Visit (INDEPENDENT_AMBULATORY_CARE_PROVIDER_SITE_OTHER): Payer: Medicare Other | Admitting: Internal Medicine

## 2012-10-12 VITALS — BP 116/77 | HR 63 | Ht 69.0 in | Wt 201.0 lb

## 2012-10-12 DIAGNOSIS — I4891 Unspecified atrial fibrillation: Secondary | ICD-10-CM

## 2012-10-12 DIAGNOSIS — I498 Other specified cardiac arrhythmias: Secondary | ICD-10-CM

## 2012-10-12 DIAGNOSIS — I495 Sick sinus syndrome: Secondary | ICD-10-CM

## 2012-10-12 LAB — PACEMAKER DEVICE OBSERVATION
BMOD-0002RV: 12
BRDY-0002RV: 60 {beats}/min
BRDY-0004RV: 110 {beats}/min
BRDY-0005RV: 50 {beats}/min
DEVICE MODEL PM: 7127228

## 2012-10-12 NOTE — Patient Instructions (Signed)
Continue all current medications. Allred - 1 year Remote check - 3 months

## 2012-10-12 NOTE — Progress Notes (Signed)
PCP: Kirstie Peri, MD  Edwin [name] is a 78 y.o. male who presents today for routine electrophysiology followup.  Since last being seen in our clinic, the patient reports doing very well.  Today, he denies symptoms of palpitations, chest pain, shortness of breath,  lower extremity edema, dizziness, presyncope, or syncope.  The patient is otherwise without complaint today.   Past Medical History  Diagnosis Date  . Hypertension   . Hyperlipidemia   . CHF (congestive heart failure)   . Permanent atrial fibrillation   . Subdural hematoma   . Peripheral neuropathy   . Bradycardia     s/p SJM PPM by Dr Johney Frame   Past Surgical History  Procedure Date  . Pacemaker insertion 10/11    St Jude  . Craniotomy   . Cholecystectomy     Current Outpatient Prescriptions  Medication Sig Dispense Refill  . aspirin 325 MG EC tablet Take 162.5 mg by mouth daily.        . furosemide (LASIX) 40 MG tablet Take 40 mg by mouth daily.       Marland Kitchen gabapentin (NEURONTIN) 300 MG capsule Take 300 mg by mouth 2 (two) times daily.        . hydrALAZINE (APRESOLINE) 25 MG tablet Take 25 mg by mouth 2 (two) times daily.      Marland Kitchen labetalol (NORMODYNE) 200 MG tablet Take 200 mg by mouth daily.        . Tamsulosin HCl (FLOMAX) 0.4 MG CAPS Take by mouth.          Physical Exam: Filed Vitals:   10/12/12 1105  BP: 116/77  Pulse: 63  Height: 5\' 9"  (1.753 m)  Weight: 201 lb (91.173 kg)  SpO2: 100%    GEN- The patient is well appearing, alert and oriented x 3 today.   Head- normocephalic, atraumatic Eyes-  Sclera clear, conjunctiva pink Ears- hearing intact Oropharynx- clear Lungs- Clear to ausculation bilaterally, normal work of breathing Chest- pacemaker pocket is well healed Heart- Regular rate and rhythm, no murmurs, rubs or gallops, PMI not laterally displaced GI- soft, NT, ND, + BS Extremities- no clubbing, cyanosis, or edema  Pacemaker interrogation- reviewed in detail today,  See PACEART  report  Assessment and Plan:   BRADYCARDIA-TACHYCARDIA SYNDROME   Normal pacemaker function  See Pace Art report  No changes today   ATRIAL FIBRILLATION   Stable permanent afib, rate controlled On ASA  Not a coumadin candidate due to prior SDH Might consider eliquis (which has been shown to have same bleeding risks as ASA with significant stroke reduction advantage) in the future.  Presently, he is clear that he does not wish to make any changes.

## 2013-01-16 ENCOUNTER — Ambulatory Visit (INDEPENDENT_AMBULATORY_CARE_PROVIDER_SITE_OTHER): Payer: Medicare Other | Admitting: *Deleted

## 2013-01-16 ENCOUNTER — Other Ambulatory Visit: Payer: Self-pay | Admitting: Internal Medicine

## 2013-01-16 DIAGNOSIS — I4891 Unspecified atrial fibrillation: Secondary | ICD-10-CM

## 2013-01-16 DIAGNOSIS — Z950 Presence of cardiac pacemaker: Secondary | ICD-10-CM

## 2013-01-18 LAB — REMOTE PACEMAKER DEVICE
BATTERY VOLTAGE: 2.98 V
RV LEAD THRESHOLD: 0.5 V
VENTRICULAR PACING PM: 99

## 2013-02-07 ENCOUNTER — Encounter: Payer: Self-pay | Admitting: *Deleted

## 2013-02-13 ENCOUNTER — Encounter: Payer: Self-pay | Admitting: Internal Medicine

## 2013-04-24 ENCOUNTER — Ambulatory Visit (INDEPENDENT_AMBULATORY_CARE_PROVIDER_SITE_OTHER): Payer: Medicare Other | Admitting: *Deleted

## 2013-04-24 ENCOUNTER — Encounter: Payer: Self-pay | Admitting: Internal Medicine

## 2013-04-24 DIAGNOSIS — I4891 Unspecified atrial fibrillation: Secondary | ICD-10-CM

## 2013-04-24 DIAGNOSIS — Z950 Presence of cardiac pacemaker: Secondary | ICD-10-CM

## 2013-04-25 LAB — REMOTE PACEMAKER DEVICE
BMOD-0002RV: 12
BRDY-0002RV: 60 {beats}/min
BRDY-0004RV: 110 {beats}/min
RV LEAD AMPLITUDE: 8.2 mv

## 2013-05-03 ENCOUNTER — Encounter: Payer: Self-pay | Admitting: *Deleted

## 2013-07-17 ENCOUNTER — Ambulatory Visit: Payer: Medicare Other | Admitting: Cardiovascular Disease

## 2013-07-25 ENCOUNTER — Ambulatory Visit (INDEPENDENT_AMBULATORY_CARE_PROVIDER_SITE_OTHER): Payer: Medicare Other | Admitting: Cardiovascular Disease

## 2013-07-25 ENCOUNTER — Encounter: Payer: Self-pay | Admitting: Cardiovascular Disease

## 2013-07-25 VITALS — BP 153/88 | HR 60 | Ht 69.5 in | Wt 201.0 lb

## 2013-07-25 DIAGNOSIS — Z950 Presence of cardiac pacemaker: Secondary | ICD-10-CM

## 2013-07-25 DIAGNOSIS — I4891 Unspecified atrial fibrillation: Secondary | ICD-10-CM

## 2013-07-25 DIAGNOSIS — Z79899 Other long term (current) drug therapy: Secondary | ICD-10-CM

## 2013-07-25 DIAGNOSIS — I10 Essential (primary) hypertension: Secondary | ICD-10-CM

## 2013-07-25 MED ORDER — APIXABAN 2.5 MG PO TABS: 2.5000 mg | ORAL_TABLET | Freq: Two times a day (BID) | ORAL | Status: DC

## 2013-07-25 NOTE — Progress Notes (Signed)
Patient ID: Edwin [name], male   DOB: 1921/07/15, 79 y.o.   MRN: [id number]  I later learned from the patient's friend, [name], that [name] has been falling asleep rather quickly throughout the day. He did not mention this to me at the time of his office visit. Given his h/o atrial fibrillation, it is quite likely he has some degree of sleep apnea, and a sleep study is warranted. I attempted to call the patient but was unable to get a hold of him. I will ask one of the nurses to contact him and see if he would be willing to have this done.

## 2013-07-25 NOTE — Progress Notes (Signed)
Patient ID: Edwin [name], male   DOB: 12/20/1920, 79 y.o.   MRN: [id number]   SUBJECTIVE: Edwin [name] has a h/o permanent atrial fibrillation for which he takes ASA, as he is apparently not a good candidate for warfarin due to a h/o subdural hematoma. He was offered Eliquis by Dr. Johney Frame in the past, given it has the same bleeding risks as ASA, but he deferred at that time. He also has a pacemaker for a h/o symptomatic bradycardia/tachy-brady syndrome. He has a h/o HTN as well.  He checks his BP at home 4-5 times per week, and it normally runs in the 130-140/70-80 mmHg range. He denies chest pain and shortness of breath, and very rarely has some mild leg swelling, for which he wears compression stockings. He seldom has palpitations. He denies lightheadedness and dizziness.  He has a peripheral neuropathy, and sometimes has balance issues, but says he hasn't had a fall since early this year.  He experienced a fall 10 years ago and sustained a subdural hematoma, for which he had a craniotomy. He's had no bleeding problems since that time, and denies hematuria and hematochezia/melena.  Soc Hx: He works 7 days a week. 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 He also manages apartments, rental properties, and storage facilities. He has a girlfriend, Edwin [name], who is also my patient.    No Known Allergies  Current Outpatient Prescriptions  Medication Sig Dispense Refill  . aspirin 325 MG EC tablet Take 162.5 mg by mouth daily.        . furosemide (LASIX) 40 MG tablet Take 40 mg by mouth daily.       Marland Kitchen gabapentin (NEURONTIN) 300 MG capsule Take 300 mg by mouth 2 (two) times daily.        . hydrALAZINE (APRESOLINE) 25 MG tablet Take 25 mg by mouth 2 (two) times daily.      Marland Kitchen labetalol (NORMODYNE) 200 MG tablet Take 200 mg by mouth daily.        Marland Kitchen olmesartan (BENICAR) 40 MG tablet Take 40 mg by mouth daily.      Marland Kitchen  pyridOXINE (VITAMIN B-6) 100 MG tablet Take 100 mg by mouth daily.      . Tamsulosin HCl (FLOMAX) 0.4 MG CAPS Take by mouth.        . thiamine (VITAMIN B-1) 100 MG tablet Take 100 mg by mouth daily.      . vitamin B-12 (CYANOCOBALAMIN) 100 MCG tablet Take 50 mcg by mouth daily.       No current facility-administered medications for this visit.    Past Medical History  Diagnosis Date  . Hypertension   . Hyperlipidemia   . CHF (congestive heart failure)   . Permanent atrial fibrillation   . Subdural hematoma   . Peripheral neuropathy   . Bradycardia     s/p SJM PPM by Dr Johney Frame    Past Surgical History  Procedure Laterality Date  . Pacemaker insertion  10/11    St Jude  . Craniotomy    . Cholecystectomy      History   Social History  . Marital Status: Widowed    Spouse Name: N/A    Number of Children: 1  . Years of Education: N/A   Occupational History  . retired    Social History Main Topics  . Smoking status: Former Games developer  . Smokeless tobacco: Not on  file  . Alcohol Use: Yes  . Drug Use: No  . Sexual Activity: Not on file   Other Topics Concern  . 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     Filed Vitals:   07/25/13 1033  BP: 153/88  Pulse: 60  Height: 5' 9.5" (1.765 m)  Weight: 201 lb (91.173 kg)    PHYSICAL EXAM General: NAD Neck: No JVD, no thyromegaly or thyroid nodule.  Lungs: Clear to auscultation bilaterally with normal respiratory effort. CV: Nondisplaced PMI.  Heart regular S1/S2, no S3/S4, no murmur.  No peripheral edema.  No carotid bruit.  Normal pedal pulses.  Abdomen: Soft, nontender, no hepatosplenomegaly, no distention.  Neurologic: Alert and oriented x 3.  Psych: Normal affect. Extremities: No clubbing or cyanosis.   ECG: reviewed and available in electronic records.      ASSESSMENT AND PLAN: 1. Permanent atrial fibrillation: we had an extensive  discussion regarding the risk of stroke with atrial fibrillation, and the marked stroke reduction with Eliquis, which has not been seen with ASA in this setting. He would like to try Eliquis, which I will start at 2.5 mg bid. I will check a BMP (to assess renal function) and a CBC (to monitor hemoglobin) in one week. I did inform him to let me know if he experiences any falls or bleeds, and it will promptly be discontinued. He does understand the bleeding risks associated with this medication, but wants to try it, as it has a similar bleeding risk to ASA.  2. HTN: well controlled on current regimen. No changes in therapy at this time. He checks it at home frequently and it is normal.   Prentice Docker, M.D., F.A.C.C.

## 2013-07-25 NOTE — Patient Instructions (Signed)
   Stop Aspirin  Begin Eliquis 2.5mg  twice a day  - new sent to pharm  Continue all other medications.   Lab for BMET, CBC due in 1 week, around 08/01/2013 Office will contact with results via phone or letter.   Your physician wants you to follow up in: 6 months.  You will receive a reminder letter in the mail one-two months in advance.  If you don't receive a letter, please call our office to schedule the follow up appointment

## 2013-07-26 ENCOUNTER — Telehealth: Payer: Self-pay | Admitting: *Deleted

## 2013-07-26 NOTE — Telephone Encounter (Signed)
Laqueta Linden, MD at 07/25/2013  3:29 PM    Status: Signed                  Patient ID: Edwin [name], male   DOB: 19-Mar-1921, 79 y.o.   MRN: [id number]  I later learned from the patient's friend, [name], that Mr. Cadenhead has been falling asleep rather quickly throughout the day. He did not mention this to me at the time of his office visit. Given his h/o atrial fibrillation, it is quite likely he has some degree of sleep apnea, and a sleep study is warranted. I attempted to call the patient but was unable to get a hold of him. I will ask one of the nurses to contact him and see if he would be willing to have this done.

## 2013-07-26 NOTE — Telephone Encounter (Signed)
Patient had OV on 07/25/2013 with Dr. Purvis Sheffield.  Below was an addendum to the initial office visit note.    Attempted to reach patient - no answer.

## 2013-07-31 ENCOUNTER — Ambulatory Visit (INDEPENDENT_AMBULATORY_CARE_PROVIDER_SITE_OTHER): Payer: Medicare Other | Admitting: *Deleted

## 2013-07-31 DIAGNOSIS — I498 Other specified cardiac arrhythmias: Secondary | ICD-10-CM

## 2013-07-31 DIAGNOSIS — I495 Sick sinus syndrome: Secondary | ICD-10-CM

## 2013-07-31 DIAGNOSIS — I4891 Unspecified atrial fibrillation: Secondary | ICD-10-CM

## 2013-07-31 DIAGNOSIS — Z950 Presence of cardiac pacemaker: Secondary | ICD-10-CM

## 2013-07-31 NOTE — Telephone Encounter (Signed)
No answer

## 2013-08-01 LAB — REMOTE PACEMAKER DEVICE
BMOD-0002RV: 12
BRDY-0005RV: 50 {beats}/min
DEVICE MODEL PM: 7127228
RV LEAD IMPEDENCE PM: 650 Ohm
VENTRICULAR PACING PM: 99

## 2013-08-01 NOTE — Telephone Encounter (Signed)
No answer

## 2013-08-09 NOTE — Telephone Encounter (Signed)
Left message at work number [employer]) - spoke with his secretary Macario Golds.  She will have him return call.

## 2013-08-09 NOTE — Telephone Encounter (Signed)
Discussed below with patient in regards to him having a sleep study.  Patient declined as he states that he feels okay & he doesn't feel that he needs anything like that.

## 2013-08-14 ENCOUNTER — Encounter: Payer: Self-pay | Admitting: *Deleted

## 2013-08-16 ENCOUNTER — Encounter: Payer: Self-pay | Admitting: *Deleted

## 2013-09-01 ENCOUNTER — Encounter: Payer: Self-pay | Admitting: Internal Medicine

## 2013-09-01 ENCOUNTER — Telehealth: Payer: Self-pay | Admitting: Cardiovascular Disease

## 2013-09-01 NOTE — Telephone Encounter (Signed)
Notified patient that he is to stop his Aspirin.  Reminded him that was discussed at last OV on 07/25/2013 & was also placed on his discharge instructions from that OV.  Patient verbalized understanding.

## 2013-09-01 NOTE — Telephone Encounter (Signed)
Mr. Kimoto started taking  Eliquis 2.5mg  twice a day -this week (due to insurance issues) Wants to know if he is suppose to stop taking ASA. Please call [phone] and you may leave a message.

## 2013-11-09 ENCOUNTER — Encounter: Payer: Medicare Other | Admitting: Internal Medicine

## 2014-01-15 ENCOUNTER — Encounter: Payer: Self-pay | Admitting: Internal Medicine

## 2014-01-15 ENCOUNTER — Ambulatory Visit (INDEPENDENT_AMBULATORY_CARE_PROVIDER_SITE_OTHER): Payer: Medicare Other | Admitting: Internal Medicine

## 2014-01-15 DIAGNOSIS — I495 Sick sinus syndrome: Secondary | ICD-10-CM

## 2014-01-17 ENCOUNTER — Ambulatory Visit (INDEPENDENT_AMBULATORY_CARE_PROVIDER_SITE_OTHER): Payer: Medicare Other | Admitting: Internal Medicine

## 2014-01-17 ENCOUNTER — Encounter: Payer: Self-pay | Admitting: Internal Medicine

## 2014-01-17 VITALS — BP 128/77 | HR 68 | Ht 69.0 in | Wt 200.0 lb

## 2014-01-17 DIAGNOSIS — I4891 Unspecified atrial fibrillation: Secondary | ICD-10-CM

## 2014-01-17 DIAGNOSIS — I495 Sick sinus syndrome: Secondary | ICD-10-CM

## 2014-01-17 DIAGNOSIS — Z950 Presence of cardiac pacemaker: Secondary | ICD-10-CM

## 2014-01-17 LAB — MDC_IDC_ENUM_SESS_TYPE_INCLINIC
Battery Voltage: 2.96 V
Implantable Pulse Generator Model: 1210
Lead Channel Impedance Value: 625 Ohm
Lead Channel Pacing Threshold Amplitude: 0.5 V
Lead Channel Pacing Threshold Pulse Width: 0.4 ms
Lead Channel Sensing Intrinsic Amplitude: 12 mV
Lead Channel Setting Pacing Amplitude: 0.75 V
Lead Channel Setting Pacing Pulse Width: 0.4 ms
MDC IDC MSMT BATTERY REMAINING LONGEVITY: 112.8 mo
MDC IDC PG SERIAL: 7127228
MDC IDC SESS DTM: 20150325174919
MDC IDC SET LEADCHNL RV SENSING SENSITIVITY: 4 mV
MDC IDC STAT BRADY RV PERCENT PACED: 99.06 %

## 2014-01-17 NOTE — Patient Instructions (Signed)
Continue all current medications. Merlin remote 04/23/2014 Your physician wants you to follow up in:  1 year.  You will receive a reminder letter in the mail one-two months in advance.  If you don't receive a letter, please call our office to schedule the follow up appointment - DR. ALLRED  

## 2014-01-17 NOTE — Progress Notes (Signed)
PCP: Kirstie PeriSHAH,ASHISH, MD  [name] is a 80 y.o. male who presents today for routine electrophysiology followup.  Since last being seen in our clinic, the patient reports doing very well.  Today, he denies symptoms of palpitations, chest pain, shortness of breath,  lower extremity edema, dizziness, presyncope, or syncope.  The patient is otherwise without complaint today.   Past Medical History  Diagnosis Date  . Hypertension   . Hyperlipidemia   . CHF (congestive heart failure)   . Permanent atrial fibrillation   . Subdural hematoma   . Peripheral neuropathy   . Bradycardia     s/p SJM PPM by Dr Johney FrameAllred   Past Surgical History  Procedure Laterality Date  . Pacemaker insertion  10/11    St Jude  . Craniotomy    . Cholecystectomy      Current Outpatient Prescriptions  Medication Sig Dispense Refill  . apixaban (ELIQUIS) 2.5 MG TABS tablet Take 1 tablet (2.5 mg total) by mouth 2 (two) times daily.  60 tablet  6  . furosemide (LASIX) 40 MG tablet Take 40 mg by mouth daily.       Marland Kitchen. gabapentin (NEURONTIN) 300 MG capsule Take 300 mg by mouth 2 (two) times daily.        . hydrALAZINE (APRESOLINE) 25 MG tablet Take 25 mg by mouth 2 (two) times daily.      Marland Kitchen. labetalol (NORMODYNE) 200 MG tablet Take 200 mg by mouth daily.        Marland Kitchen. olmesartan (BENICAR) 40 MG tablet Take 40 mg by mouth daily.      Marland Kitchen. pyridOXINE (VITAMIN B-6) 100 MG tablet Take 100 mg by mouth daily.      . Tamsulosin HCl (FLOMAX) 0.4 MG CAPS Take by mouth.        . thiamine (VITAMIN B-1) 100 MG tablet Take 100 mg by mouth daily.      . vitamin B-12 (CYANOCOBALAMIN) 100 MCG tablet Take 50 mcg by mouth daily.       No current facility-administered medications for this visit.    Physical Exam: Filed Vitals:   01/17/14 1648  BP: 128/77  Pulse: 68  Height: 5\' 9"  (1.753 m)  Weight: 200 lb (90.719 kg)  SpO2: 96%    GEN- The patient is well appearing, alert and oriented x 3 today.   Head- normocephalic,  atraumatic Eyes-  Sclera clear, conjunctiva pink Ears- hearing intact Oropharynx- clear Lungs- Clear to ausculation bilaterally, normal work of breathing Chest- pacemaker pocket is well healed Heart- Regular rate and rhythm, no murmurs, rubs or gallops, PMI not laterally displaced GI- soft, NT, ND, + BS Extremities- no clubbing, cyanosis, or edema  Pacemaker interrogation- reviewed in detail today,  See PACEART report  Assessment and Plan:   BRADYCARDIA-TACHYCARDIA SYNDROME   Normal pacemaker function  See Pace Art report  No changes today   ATRIAL FIBRILLATION   Stable permanent afib, rate controlled On eliquis for stroke prevention  Merlin Return to the device clinic in 1 year

## 2014-01-22 ENCOUNTER — Ambulatory Visit (INDEPENDENT_AMBULATORY_CARE_PROVIDER_SITE_OTHER): Payer: Medicare Other | Admitting: Cardiovascular Disease

## 2014-01-22 ENCOUNTER — Encounter: Payer: Self-pay | Admitting: Cardiovascular Disease

## 2014-01-22 VITALS — BP 145/78 | HR 67 | Ht 69.5 in | Wt 202.0 lb

## 2014-01-22 DIAGNOSIS — R400 Somnolence: Secondary | ICD-10-CM

## 2014-01-22 DIAGNOSIS — Z79899 Other long term (current) drug therapy: Secondary | ICD-10-CM

## 2014-01-22 DIAGNOSIS — I4891 Unspecified atrial fibrillation: Secondary | ICD-10-CM

## 2014-01-22 DIAGNOSIS — Z950 Presence of cardiac pacemaker: Secondary | ICD-10-CM

## 2014-01-22 DIAGNOSIS — R404 Transient alteration of awareness: Secondary | ICD-10-CM

## 2014-01-22 DIAGNOSIS — I10 Essential (primary) hypertension: Secondary | ICD-10-CM

## 2014-01-22 NOTE — Progress Notes (Signed)
Patient ID: Edwin [name], male   DOB: Aug 30, 1921, 80 y.o.   MRN: [id number]      SUBJECTIVE: Edwin [name] has a h/o permanent atrial fibrillation for which he takes Eliquis (started at his last visit with me in 06/2013). He also has a pacemaker for a h/o symptomatic bradycardia/tachy-brady syndrome.  He has a h/o HTN as well.  He recently saw Dr. Johney Frame and his pacemaker was found to be functioning normally.  He denies chest pain and shortness of breath, and very rarely has some mild leg swelling, but has not recently worn compression stockings. He denies palpitations, lightheadedness and dizziness.  He has a peripheral neuropathy, and sometimes has balance issues, and says he may have fallen on two occasions, but denies hitting his head. He says he tripped. He does not walk with a cane but has considered doing so. If he sits still for some time, he feels sleepy. He hit his left ankle a few weeks back, and feels a tenderness but no pain while walking, and says the pain is in his skin, rather than the bone.  He experienced a fall 10 years ago and sustained a subdural hematoma, for which he had a craniotomy. He's had no bleeding problems since that time, and denies hematuria and hematochezia/melena.   Soc Hx: He works 7 days a week. 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 He also manages apartments, rental properties, and storage facilities. He has a girlfriend, [name], who is also my patient.     No Known Allergies  Current Outpatient Prescriptions  Medication Sig Dispense Refill  . apixaban (ELIQUIS) 2.5 MG TABS tablet Take 1 tablet (2.5 mg total) by mouth 2 (two) times daily.  60 tablet  6  . furosemide (LASIX) 40 MG tablet Take 40 mg by mouth daily.       Marland Kitchen gabapentin (NEURONTIN) 300 MG capsule Take 300 mg by mouth 2 (two) times daily.        . hydrALAZINE (APRESOLINE) 25 MG tablet Take 25 mg by mouth 2  (two) times daily.      Marland Kitchen labetalol (NORMODYNE) 200 MG tablet Take 200 mg by mouth daily.        Marland Kitchen olmesartan (BENICAR) 40 MG tablet Take 40 mg by mouth daily.      Marland Kitchen pyridOXINE (VITAMIN B-6) 100 MG tablet Take 100 mg by mouth daily.      . Tamsulosin HCl (FLOMAX) 0.4 MG CAPS Take by mouth.        . thiamine (VITAMIN B-1) 100 MG tablet Take 100 mg by mouth daily.      . vitamin B-12 (CYANOCOBALAMIN) 100 MCG tablet Take 50 mcg by mouth daily.       No current facility-administered medications for this visit.    Past Medical History  Diagnosis Date  . Hypertension   . Hyperlipidemia   . CHF (congestive heart failure)   . Permanent atrial fibrillation   . Subdural hematoma   . Peripheral neuropathy   . Bradycardia     s/p SJM PPM by Dr Johney Frame    Past Surgical History  Procedure Laterality Date  . Pacemaker insertion  10/11    St Jude  . Craniotomy    . Cholecystectomy      History   Social History  . Marital Status: Widowed    Spouse Name: N/A    Number of Children:  1  . Years of Education: N/A   Occupational History  . retired    Social History Main Topics  . Smoking status: Former Games developermoker  . Smokeless tobacco: Not on file  . Alcohol Use: Yes  . Drug Use: No  . Sexual Activity: Not on file   Other Topics Concern  . 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     Filed Vitals:   01/22/14 0820  BP: 145/78  Pulse: 67  Height: 5' 9.5" (1.765 m)  Weight: 202 lb (91.627 kg)  SpO2: 100%    PHYSICAL EXAM General: NAD Neck: No JVD, no thyromegaly. Lungs: Clear to auscultation bilaterally with normal respiratory effort. CV: Nondisplaced PMI.  Regular rate and rhythm, normal S1/S2, no S3/S4, no murmur. No pretibial or periankle edema.  No carotid bruit.  Normal pedal pulses.  Abdomen: Soft, nontender, no hepatosplenomegaly, no distention.  Neurologic: Alert and oriented x 3.  Psych: Normal  affect. Extremities: No clubbing or cyanosis.   ECG: reviewed and available in electronic records.      ASSESSMENT AND PLAN: 1. Permanent atrial fibrillation: rate controlled. Tolerating Eliquis. 2. HTN: well controlled for age on current regimen. No changes in therapy at this time.  3. Sleep apnea: offered a sleep study but the patient declined previously.  Dispo: f/u 6 months.  Prentice DockerSuresh Spence Soberano, M.D., F.A.C.C.

## 2014-01-22 NOTE — Patient Instructions (Signed)
Continue all current medications. Your physician wants you to follow up in: 6 months.  You will receive a reminder letter in the mail one-two months in advance.  If you don't receive a letter, please call our office to schedule the follow up appointment   

## 2014-02-01 NOTE — Progress Notes (Signed)
Appointment cancelled

## 2014-04-06 ENCOUNTER — Other Ambulatory Visit: Payer: Self-pay | Admitting: Cardiovascular Disease

## 2014-04-23 ENCOUNTER — Ambulatory Visit (INDEPENDENT_AMBULATORY_CARE_PROVIDER_SITE_OTHER): Payer: Medicare Other | Admitting: *Deleted

## 2014-04-23 DIAGNOSIS — I495 Sick sinus syndrome: Secondary | ICD-10-CM

## 2014-04-23 DIAGNOSIS — I4891 Unspecified atrial fibrillation: Secondary | ICD-10-CM

## 2014-04-23 NOTE — Progress Notes (Signed)
Remote pacemaker transmission.   

## 2014-04-24 LAB — MDC_IDC_ENUM_SESS_TYPE_REMOTE
Battery Remaining Longevity: 124 mo
Battery Remaining Percentage: 82 %
Date Time Interrogation Session: 20150629074742
Implantable Pulse Generator Model: 1210
Implantable Pulse Generator Serial Number: 7127228
Lead Channel Pacing Threshold Amplitude: 0.625 V
Lead Channel Pacing Threshold Pulse Width: 0.4 ms
Lead Channel Sensing Intrinsic Amplitude: 12 mV
Lead Channel Setting Pacing Amplitude: 0.875
Lead Channel Setting Sensing Sensitivity: 4 mV
MDC IDC MSMT BATTERY VOLTAGE: 2.96 V
MDC IDC MSMT LEADCHNL RV IMPEDANCE VALUE: 650 Ohm
MDC IDC SET LEADCHNL RV PACING PULSEWIDTH: 0.4 ms
MDC IDC STAT BRADY RV PERCENT PACED: 98 %

## 2014-05-02 ENCOUNTER — Encounter: Payer: Self-pay | Admitting: Cardiology

## 2014-05-07 ENCOUNTER — Encounter: Payer: Self-pay | Admitting: Internal Medicine

## 2014-07-23 ENCOUNTER — Ambulatory Visit (INDEPENDENT_AMBULATORY_CARE_PROVIDER_SITE_OTHER): Payer: Medicare Other | Admitting: *Deleted

## 2014-07-23 ENCOUNTER — Encounter: Payer: Self-pay | Admitting: Internal Medicine

## 2014-07-23 DIAGNOSIS — I495 Sick sinus syndrome: Secondary | ICD-10-CM

## 2014-07-23 DIAGNOSIS — I4891 Unspecified atrial fibrillation: Secondary | ICD-10-CM

## 2014-07-23 LAB — MDC_IDC_ENUM_SESS_TYPE_REMOTE
Battery Remaining Longevity: 124 mo
Battery Remaining Percentage: 82 %
Brady Statistic RV Percent Paced: 98 %
Implantable Pulse Generator Model: 1210
Implantable Pulse Generator Serial Number: 7127228
Lead Channel Impedance Value: 630 Ohm
Lead Channel Pacing Threshold Pulse Width: 0.4 ms
Lead Channel Sensing Intrinsic Amplitude: 7.6 mV
Lead Channel Setting Pacing Amplitude: 0.75 V
Lead Channel Setting Sensing Sensitivity: 4 mV
MDC IDC MSMT BATTERY VOLTAGE: 2.96 V
MDC IDC MSMT LEADCHNL RV PACING THRESHOLD AMPLITUDE: 0.5 V
MDC IDC SESS DTM: 20150928061821
MDC IDC SET LEADCHNL RV PACING PULSEWIDTH: 0.4 ms

## 2014-07-23 NOTE — Progress Notes (Signed)
Remote pacemaker transmission.   

## 2014-07-26 ENCOUNTER — Encounter: Payer: Self-pay | Admitting: Cardiovascular Disease

## 2014-07-26 ENCOUNTER — Ambulatory Visit (INDEPENDENT_AMBULATORY_CARE_PROVIDER_SITE_OTHER): Payer: Medicare Other | Admitting: Cardiovascular Disease

## 2014-07-26 VITALS — BP 113/72 | HR 66 | Ht 69.0 in | Wt 200.5 lb

## 2014-07-26 DIAGNOSIS — I4891 Unspecified atrial fibrillation: Secondary | ICD-10-CM

## 2014-07-26 DIAGNOSIS — Z950 Presence of cardiac pacemaker: Secondary | ICD-10-CM

## 2014-07-26 DIAGNOSIS — I495 Sick sinus syndrome: Secondary | ICD-10-CM

## 2014-07-26 DIAGNOSIS — I10 Essential (primary) hypertension: Secondary | ICD-10-CM

## 2014-07-26 NOTE — Patient Instructions (Signed)
Continue all current medications. Your physician wants you to follow up in: 6 months.  You will receive a reminder letter in the mail one-two months in advance.  If you don't receive a letter, please call our office to schedule the follow up appointment   

## 2014-07-26 NOTE — Progress Notes (Signed)
Patient ID: Edwin [name], male   DOB: 09/15/1921, 80 y.o.   MRN: [id number]      SUBJECTIVE: Mr. Sanders has a history of permanent atrial fibrillation for which he takes Eliquis. He has a pacemaker for a history of symptomatic bradycardia/tachy-brady syndrome.  He has a history of HTN as well.  He has a peripheral neuropathy, and sometimes has balance issues, but has not had any falls since his last visit.  He does not walk with a cane but has considered doing so.  He experienced a fall 10 years ago and sustained a subdural hematoma, for which he had a craniotomy. He has had no bleeding problems since that time, and denies hematuria and hematochezia/melena.  His wife passed away 11 years ago. They traveled to all 50 states.   Soc Hx: He works 7 days a week. 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 He also manages apartments, rental properties, and storage facilities. He has a girlfriend, [name], who is also my patient.       Review of Systems: As per "subjective", otherwise negative.  No Known Allergies  Current Outpatient Prescriptions  Medication Sig Dispense Refill  . ELIQUIS 2.5 MG TABS tablet TAKE 1 TABLET BY MOUTH TWICE DAILY  60 tablet  6  . furosemide (LASIX) 40 MG tablet Take 40 mg by mouth daily.       Marland Kitchen gabapentin (NEURONTIN) 300 MG capsule Take 300 mg by mouth 2 (two) times daily.        . hydrALAZINE (APRESOLINE) 25 MG tablet Take 25 mg by mouth 2 (two) times daily.      Marland Kitchen labetalol (NORMODYNE) 200 MG tablet Take 200 mg by mouth daily.        Marland Kitchen olmesartan (BENICAR) 40 MG tablet Take 40 mg by mouth daily.      Marland Kitchen pyridOXINE (VITAMIN B-6) 100 MG tablet Take 100 mg by mouth daily.      . Tamsulosin HCl (FLOMAX) 0.4 MG CAPS Take by mouth.        . thiamine (VITAMIN B-1) 100 MG tablet Take 100 mg by mouth daily.      . vitamin B-12 (CYANOCOBALAMIN) 100 MCG tablet Take 50 mcg by mouth daily.        No current facility-administered medications for this visit.    Past Medical History  Diagnosis Date  . Hypertension   . Hyperlipidemia   . CHF (congestive heart failure)   . Permanent atrial fibrillation   . Subdural hematoma   . Peripheral neuropathy   . Bradycardia     s/p SJM PPM by Dr Johney Frame    Past Surgical History  Procedure Laterality Date  . Pacemaker insertion  10/11    St Jude  . Craniotomy    . Cholecystectomy      History   Social History  . Marital Status: Widowed    Spouse Name: N/A    Number of Children: 1  . Years of Education: N/A   Occupational History  . retired    Social History Main Topics  . Smoking status: Former Smoker    Types: Cigarettes    Quit date: 07/26/1989  . Smokeless tobacco: Never Used     Comment: 07/26/14 Ocasionally (socially) smoked over 25years ago- AJ  . Alcohol Use: Yes     Comment: 07/26/14- Drinks wine once a day everyday- AJ  . Drug Use:  No  . Sexual Activity: Not on file   Other Topics Concern  . 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     Filed Vitals:   07/26/14 1132  BP: 113/72  Pulse: 66  Height: 5\' 9"  (1.753 m)  Weight: 200 lb 8 oz (90.946 kg)  SpO2: 98%    PHYSICAL EXAM General: NAD HEENT: Normal. Neck: No JVD, no thyromegaly. Lungs: Clear to auscultation bilaterally with normal respiratory effort. CV: Nondisplaced PMI.  Regular rate and rhythm, normal S1/S2, no S3/S4, no murmur. No pretibial or periankle edema.  No carotid bruit.  Normal pedal pulses.  Abdomen: Soft, nontender, no hepatosplenomegaly, no distention.  Neurologic: Alert and oriented x 3.  Psych: Normal affect. Skin: Normal. Musculoskeletal: Normal range of motion, no gross deformities. Extremities: No clubbing or cyanosis.   ECG: Most recent ECG reviewed.      ASSESSMENT AND PLAN: 1. Permanent atrial fibrillation: Stable. Continue Eliquis  2.5 mg bid.  2. Essential HTN: Controlled on present therapy which includes hydralazine 25 mg twice daily, labetalol 200 mg daily and olmesartan 40 mg daily. 3. Sleep apnea: Previously offered a sleep study but the patient declined.  4. Pacemaker: Device interrogation on 07/23/2014 demonstrated normal device function with permanent atrial fibrillation and no high ventricular rates noted.  Dispo: f/u 6 months.  Prentice DockerSuresh Yanis Larin, M.D., F.A.C.C.

## 2014-08-01 ENCOUNTER — Encounter: Payer: Self-pay | Admitting: Cardiology

## 2014-10-24 ENCOUNTER — Ambulatory Visit (INDEPENDENT_AMBULATORY_CARE_PROVIDER_SITE_OTHER): Payer: Medicare Other | Admitting: *Deleted

## 2014-10-24 ENCOUNTER — Telehealth: Payer: Self-pay | Admitting: Cardiology

## 2014-10-24 ENCOUNTER — Encounter: Payer: Medicare Other | Admitting: *Deleted

## 2014-10-24 DIAGNOSIS — I495 Sick sinus syndrome: Secondary | ICD-10-CM

## 2014-10-24 LAB — MDC_IDC_ENUM_SESS_TYPE_REMOTE
Battery Remaining Longevity: 123 mo
Battery Remaining Percentage: 82 %
Battery Voltage: 2.96 V
Implantable Pulse Generator Model: 1210
Lead Channel Pacing Threshold Pulse Width: 0.4 ms
Lead Channel Sensing Intrinsic Amplitude: 7.6 mV
Lead Channel Setting Pacing Amplitude: 0.75 V
Lead Channel Setting Sensing Sensitivity: 4 mV
MDC IDC MSMT LEADCHNL RV IMPEDANCE VALUE: 600 Ohm
MDC IDC MSMT LEADCHNL RV PACING THRESHOLD AMPLITUDE: 0.5 V
MDC IDC PG SERIAL: 7127228
MDC IDC SESS DTM: 20151230085314
MDC IDC SET LEADCHNL RV PACING PULSEWIDTH: 0.4 ms
MDC IDC STAT BRADY RV PERCENT PACED: 98 %

## 2014-10-24 NOTE — Telephone Encounter (Signed)
Attempted to confirm remote transmission with pt. No answer and was unable to leave a message.   

## 2014-10-25 ENCOUNTER — Encounter: Payer: Self-pay | Admitting: Cardiology

## 2014-10-29 ENCOUNTER — Other Ambulatory Visit: Payer: Self-pay | Admitting: Cardiology

## 2014-10-29 NOTE — Progress Notes (Signed)
Remote pacemaker transmission.   

## 2014-10-30 ENCOUNTER — Other Ambulatory Visit: Payer: Self-pay | Admitting: Cardiovascular Disease

## 2014-11-09 ENCOUNTER — Encounter: Payer: Self-pay | Admitting: Cardiology

## 2014-11-14 ENCOUNTER — Encounter: Payer: Self-pay | Admitting: Internal Medicine

## 2015-01-07 ENCOUNTER — Telehealth: Payer: Self-pay | Admitting: *Deleted

## 2015-01-07 NOTE — Telephone Encounter (Signed)
Eliquis 2.5mg  - approved through 01/07/2016 under Medicare Par D benefit.

## 2015-01-22 ENCOUNTER — Encounter: Payer: Self-pay | Admitting: Cardiovascular Disease

## 2015-01-22 ENCOUNTER — Ambulatory Visit (INDEPENDENT_AMBULATORY_CARE_PROVIDER_SITE_OTHER): Payer: Medicare Other | Admitting: Cardiovascular Disease

## 2015-01-22 VITALS — BP 127/78 | HR 64 | Ht 69.0 in | Wt 196.0 lb

## 2015-01-22 DIAGNOSIS — I4891 Unspecified atrial fibrillation: Secondary | ICD-10-CM | POA: Diagnosis not present

## 2015-01-22 DIAGNOSIS — Z950 Presence of cardiac pacemaker: Secondary | ICD-10-CM

## 2015-01-22 DIAGNOSIS — I495 Sick sinus syndrome: Secondary | ICD-10-CM

## 2015-01-22 DIAGNOSIS — I10 Essential (primary) hypertension: Secondary | ICD-10-CM | POA: Diagnosis not present

## 2015-01-22 NOTE — Patient Instructions (Signed)
Continue all current medications. Your physician wants you to follow up in: 7 months.  You will receive a reminder letter in the mail one-two months in advance.  If you don't receive a letter, please call our office to schedule the follow up appointment   

## 2015-01-22 NOTE — Progress Notes (Signed)
Patient ID: Edwin [name], male   DOB: January 29, 1921, 81 y.o.   MRN: [id number]       SUBJECTIVE: [name] has a history of permanent atrial fibrillation for which he takes Eliquis. He has a pacemaker for a history of symptomatic bradycardia/tachy-brady syndrome.  He has a history of essential HTN as well.  He has a peripheral neuropathy, and sometimes has balance issues, but has not had any falls since his last visit.   He does not walk with a cane but has considered doing so.  He experienced a fall 10 years ago and sustained a subdural hematoma, for which he had a craniotomy. He has had no bleeding problems since that time, and denies hematuria and hematochezia/melena.   He remains active managing the [employer].   Soc Hx: He works 7 days a week. He build the [employer] [place] here in [place], and continues to manage it along with his son and grandson. He also manages apartments, rental properties, and storage facilities. He has a girlfriend, Edwin [name], who is also my patient. His wife passed away 11 years ago. They traveled to all 50 states.   Review of Systems: As per "subjective", otherwise negative.  No Known Allergies  Current Outpatient Prescriptions  Medication Sig Dispense Refill  . ELIQUIS 2.5 MG TABS tablet TAKE 1 TABLET BY MOUTH TWICE DAILY 60 tablet 6  . furosemide (LASIX) 40 MG tablet Take 40 mg by mouth daily.     Marland Kitchen. gabapentin (NEURONTIN) 300 MG capsule Take 300 mg by mouth 2 (two) times daily.      . hydrALAZINE (APRESOLINE) 25 MG tablet Take 25 mg by mouth 2 (two) times daily.    Marland Kitchen. labetalol (NORMODYNE) 200 MG tablet Take 200 mg by mouth daily.      Marland Kitchen. olmesartan (BENICAR) 40 MG tablet Take 40 mg by mouth daily.    Marland Kitchen. pyridOXINE (VITAMIN B-6) 100 MG tablet Take 100 mg by mouth daily.    . Tamsulosin HCl (FLOMAX) 0.4 MG CAPS Take by mouth.      . thiamine (VITAMIN B-1) 100 MG tablet Take 100 mg by mouth daily.    . vitamin B-12  (CYANOCOBALAMIN) 100 MCG tablet Take 50 mcg by mouth daily.     No current facility-administered medications for this visit.    Past Medical History  Diagnosis Date  . Hypertension   . Hyperlipidemia   . CHF (congestive heart failure)   . Permanent atrial fibrillation   . Subdural hematoma   . Peripheral neuropathy   . Bradycardia     s/p SJM PPM by Dr Johney FrameAllred    Past Surgical History  Procedure Laterality Date  . Pacemaker insertion  10/11    St Jude  . Craniotomy    . Cholecystectomy      History   Social History  . Marital Status: Widowed    Spouse Name: N/A  . Number of Children: 1  . Years of Education: N/A   Occupational History  . retired    Social History Main Topics  . Smoking status: Former Smoker    Types: Cigarettes    Quit date: 07/26/1989  . Smokeless tobacco: Never Used     Comment: 07/26/14 Ocasionally (socially) smoked over 25years ago- AJ  . Alcohol Use: Yes     Comment: 07/26/14- Drinks wine once a day everyday- AJ  . Drug Use: No  . Sexual Activity: Not on file   Other Topics Concern  .  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     Filed Vitals:   01/22/15 1025  Height:  (1.753 m)     Filed Vitals:   01/22/15 1025  BP: 127/78  Pulse: 64  Height:  (1.753 m)  Weight: 196 lb (88.905 kg)     PHYSICAL EXAM General: NAD HEENT: Normal. Neck: No JVD, no thyromegaly. Lungs: Clear to auscultation bilaterally with normal respiratory effort. CV: Nondisplaced PMI.  Regular rate and rhythm, normal S1/faint split S2, no S3/S4, no murmur. No pretibial or periankle edema.  No carotid bruit.  Normal pedal pulses.  Abdomen: Soft, nontender, no distention.  Neurologic: Alert and oriented.  Psych: Normal affect. Skin: Normal. Musculoskeletal: Normal range of motion, no gross deformities. Extremities: No clubbing or cyanosis.   ECG: Most recent ECG  reviewed.    ASSESSMENT AND PLAN: 1. Permanent atrial fibrillation: Stable. Continue Eliquis 2.5 mg bid.  2. Essential HTN: Controlled on present therapy which includes hydralazine 25 mg twice daily, labetalol 300 mg daily and olmesartan 40 mg daily. 3. Sleep apnea: Previously offered a sleep study but the patient declined.  4. Pacemaker: Device interrogation on 10/24/2014 demonstrated normal device function with permanent atrial fibrillation and no high ventricular rates noted.  Dispo: f/u in October.   Prentice Docker, M.D., F.A.C.C.

## 2015-03-26 ENCOUNTER — Encounter: Payer: Self-pay | Admitting: Internal Medicine

## 2015-04-05 ENCOUNTER — Ambulatory Visit (INDEPENDENT_AMBULATORY_CARE_PROVIDER_SITE_OTHER): Payer: Medicare Other | Admitting: Internal Medicine

## 2015-04-05 ENCOUNTER — Encounter: Payer: Self-pay | Admitting: Internal Medicine

## 2015-04-05 VITALS — BP 108/70 | HR 65 | Ht 69.0 in | Wt 195.0 lb

## 2015-04-05 DIAGNOSIS — I482 Chronic atrial fibrillation, unspecified: Secondary | ICD-10-CM

## 2015-04-05 DIAGNOSIS — I495 Sick sinus syndrome: Secondary | ICD-10-CM

## 2015-04-05 LAB — CUP PACEART INCLINIC DEVICE CHECK
Battery Remaining Longevity: 140.4 mo
Battery Remaining Longevity: 140.4 mo
Battery Voltage: 2.96 V
Brady Statistic RV Percent Paced: 98 %
Brady Statistic RV Percent Paced: 98 %
Date Time Interrogation Session: 20160610092901
Lead Channel Impedance Value: 650 Ohm
Lead Channel Impedance Value: 650 Ohm
Lead Channel Pacing Threshold Amplitude: 0.5 V
Lead Channel Pacing Threshold Pulse Width: 0.4 ms
Lead Channel Setting Pacing Amplitude: 0.75 V
Lead Channel Setting Pacing Pulse Width: 0.4 ms
Lead Channel Setting Pacing Pulse Width: 0.4 ms
Lead Channel Setting Sensing Sensitivity: 3 mV
Lead Channel Setting Sensing Sensitivity: 3 mV
MDC IDC MSMT BATTERY VOLTAGE: 2.96 V
MDC IDC MSMT LEADCHNL RV PACING THRESHOLD AMPLITUDE: 0.5 V
MDC IDC MSMT LEADCHNL RV PACING THRESHOLD PULSEWIDTH: 0.4 ms
MDC IDC PG SERIAL: 7127228
MDC IDC SESS DTM: 20160610094343
MDC IDC SET LEADCHNL RV PACING AMPLITUDE: 0.75 V
Pulse Gen Serial Number: 7127228

## 2015-04-05 NOTE — Patient Instructions (Signed)
Your physician recommends that you continue on your current medications as directed. Please refer to the Current Medication list given to you today. Merlin device check on 07/08/15. Your physician recommends that you schedule a follow-up appointment in: 12 months with Dr. Allred. You will receive a reminder letter in the mail in about 10 months reminding you to call and schedule your appointment. If you don't receive this letter, please contact our office. 

## 2015-04-05 NOTE — Progress Notes (Signed)
PCP: Kirstie Peri, MD  Edwin [name] is a 81 y.o. male who presents today for routine electrophysiology followup.  Since last being seen in our clinic, the patient reports doing very well.  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  He has rare postural dizziness. Today, he denies symptoms of palpitations, chest pain, shortness of breath,  lower extremity edema,  presyncope, or syncope.  The patient is otherwise without complaint today.   Past Medical History  Diagnosis Date  . Hypertension   . Hyperlipidemia   . CHF (congestive heart failure)   . Permanent atrial fibrillation   . Subdural hematoma   . Peripheral neuropathy   . Bradycardia     s/p SJM PPM by Dr Johney Frame   Past Surgical History  Procedure Laterality Date  . Pacemaker insertion  10/11    St Jude  . Craniotomy    . Cholecystectomy      Current Outpatient Prescriptions  Medication Sig Dispense Refill  . ELIQUIS 2.5 MG TABS tablet TAKE 1 TABLET BY MOUTH TWICE DAILY 60 tablet 6  . furosemide (LASIX) 40 MG tablet Take 60 mg by mouth daily.     Marland Kitchen gabapentin (NEURONTIN) 300 MG capsule Take 300 mg by mouth 2 (two) times daily.      . hydrALAZINE (APRESOLINE) 25 MG tablet Take 25 mg by mouth 2 (two) times daily.    Marland Kitchen labetalol (NORMODYNE) 200 MG tablet Take 300 mg by mouth every morning. & 200 mg by mouth at bedtime    . olmesartan (BENICAR) 40 MG tablet Take 40 mg by mouth daily.    Marland Kitchen pyridOXINE (VITAMIN B-6) 100 MG tablet Take 100 mg by mouth daily.    . Tamsulosin HCl (FLOMAX) 0.4 MG CAPS Take 0.4 mg by mouth daily.     Marland Kitchen thiamine (VITAMIN B-1) 100 MG tablet Take 100 mg by mouth daily.    . vitamin B-12 (CYANOCOBALAMIN) 100 MCG tablet Take 50 mcg by mouth daily.     No current facility-administered medications for this visit.    Physical Exam: Filed Vitals:   04/05/15 0900  BP: 108/70  Pulse: 65  Height: 5\' 9"  (1.753 m)  Weight: 88.451 kg (195 lb)  SpO2: 99%    GEN- The  patient is well appearing, alert and oriented x 3 today.   Head- normocephalic, atraumatic Eyes-  Sclera clear, conjunctiva pink Ears- hearing intact Oropharynx- clear Lungs- Clear to ausculation bilaterally, normal work of breathing Chest- pacemaker pocket is well healed Heart- Regular rate and rhythm, no murmurs, rubs or gallops, PMI not laterally displaced GI- soft, NT, ND, + BS Extremities- no clubbing, cyanosis, or edema  Pacemaker interrogation- reviewed in detail today,  See PACEART report  Assessment and Plan:   BRADYCARDIA-TACHYCARDIA SYNDROME   Normal pacemaker function  See Pace Art report  No changes today   ATRIAL FIBRILLATION   Stable permanent afib, rate controlled On eliquis for stroke prevention  Merlin Return to the device clinic in 1 year

## 2015-04-30 ENCOUNTER — Encounter: Payer: Self-pay | Admitting: Internal Medicine

## 2015-05-28 ENCOUNTER — Other Ambulatory Visit: Payer: Self-pay | Admitting: Cardiovascular Disease

## 2015-07-08 ENCOUNTER — Ambulatory Visit (INDEPENDENT_AMBULATORY_CARE_PROVIDER_SITE_OTHER): Payer: Medicare Other | Admitting: *Deleted

## 2015-07-08 DIAGNOSIS — I495 Sick sinus syndrome: Secondary | ICD-10-CM

## 2015-07-08 NOTE — Progress Notes (Signed)
Remote pacemaker transmission.   

## 2015-07-16 LAB — CUP PACEART REMOTE DEVICE CHECK
Battery Remaining Longevity: 147 mo
Battery Voltage: 2.96 V
Lead Channel Impedance Value: 650 Ohm
Lead Channel Pacing Threshold Amplitude: 0.5 V
Lead Channel Pacing Threshold Pulse Width: 0.4 ms
Lead Channel Sensing Intrinsic Amplitude: 8.5 mV
Lead Channel Setting Pacing Pulse Width: 0.4 ms
Lead Channel Setting Sensing Sensitivity: 3 mV
MDC IDC MSMT BATTERY REMAINING PERCENTAGE: 95.5 %
MDC IDC SESS DTM: 20160912062551
MDC IDC SET LEADCHNL RV PACING AMPLITUDE: 0.75 V
MDC IDC STAT BRADY RV PERCENT PACED: 98 %
Pulse Gen Model: 1210
Pulse Gen Serial Number: 7127228

## 2015-07-23 ENCOUNTER — Encounter: Payer: Self-pay | Admitting: Cardiology

## 2015-07-30 ENCOUNTER — Encounter: Payer: Self-pay | Admitting: Internal Medicine

## 2015-08-07 ENCOUNTER — Ambulatory Visit (INDEPENDENT_AMBULATORY_CARE_PROVIDER_SITE_OTHER): Payer: Medicare Other | Admitting: Cardiovascular Disease

## 2015-08-07 ENCOUNTER — Encounter: Payer: Self-pay | Admitting: Cardiovascular Disease

## 2015-08-07 VITALS — BP 109/66 | HR 62 | Ht 69.0 in | Wt 192.0 lb

## 2015-08-07 DIAGNOSIS — I4891 Unspecified atrial fibrillation: Secondary | ICD-10-CM

## 2015-08-07 DIAGNOSIS — I482 Chronic atrial fibrillation, unspecified: Secondary | ICD-10-CM

## 2015-08-07 DIAGNOSIS — I10 Essential (primary) hypertension: Secondary | ICD-10-CM

## 2015-08-07 DIAGNOSIS — Z950 Presence of cardiac pacemaker: Secondary | ICD-10-CM | POA: Diagnosis not present

## 2015-08-07 NOTE — Progress Notes (Signed)
Patient ID: Edwin [name], male   DOB: 1921/10/26, 81 y.o.   MRN: [id number]      SUBJECTIVE: [name] has a history of chronic atrial fibrillation for which he takes Eliquis. HeSharlett [name] has a pacemaker for a history of symptomatic bradycardia/tachy-brady syndrome and follows with Dr. Johney FrameAllred.  He has a history of essential HTN as well.  He has a peripheral neuropathy, and sometimes has balance issues, but has not had any falls since his last visit.   He experienced a fall 10 years ago and sustained a subdural hematoma, for which he had a craniotomy. He has had no bleeding problems since that time, and denies hematuria and hematochezia/melena.   His last fall was reportedly in January 2016.  Has a bit of a dry cough, but no leg swelling. Denies chest pain and shortness of breath.  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  ECG performed today shows a paced rhythm.  Soc Hx: He works 7 days a week. He build the [employer] [place] here in [place], and continues to manage it along with his son and grandson. He also manages apartments, rental properties, and storage facilities. He has a girlfriend, Edwin [name], who is also my patient. His wife passed about 12 years ago. They traveled to all 50 states.   Review of Systems: As per "subjective", otherwise negative.  No Known Allergies  Current Outpatient Prescriptions  Medication Sig Dispense Refill  . ELIQUIS 2.5 MG TABS tablet TAKE 1 TABLET BY MOUTH TWICE DAILY 60 tablet 6  . furosemide (LASIX) 40 MG tablet Take 60 mg by mouth daily.     Marland Kitchen. gabapentin (NEURONTIN) 300 MG capsule Take 300 mg by mouth 2 (two) times daily.      . hydrALAZINE (APRESOLINE) 25 MG tablet Take 25 mg by mouth 2 (two) times daily.    Marland Kitchen. labetalol (NORMODYNE) 200 MG tablet Take 300 mg by mouth every morning. & 200 mg by mouth at bedtime    . olmesartan (BENICAR) 40 MG tablet Take 40 mg by mouth daily.    Marland Kitchen.  pyridOXINE (VITAMIN B-6) 100 MG tablet Take 100 mg by mouth daily.    . Tamsulosin HCl (FLOMAX) 0.4 MG CAPS Take 0.4 mg by mouth daily.     Marland Kitchen. thiamine (VITAMIN B-1) 100 MG tablet Take 100 mg by mouth daily.    . vitamin B-12 (CYANOCOBALAMIN) 100 MCG tablet Take 50 mcg by mouth daily.     No current facility-administered medications for this visit.    Past Medical History  Diagnosis Date  . Hypertension   . Hyperlipidemia   . CHF (congestive heart failure) (HCC)   . Permanent atrial fibrillation (HCC)   . Subdural hematoma (HCC)   . Peripheral neuropathy (HCC)   . Bradycardia     s/p SJM PPM by Dr Johney FrameAllred    Past Surgical History  Procedure Laterality Date  . Pacemaker insertion  10/11    St Jude  . Craniotomy    . Cholecystectomy      Social History   Social History  . Marital Status: Widowed    Spouse Name: N/A  . Number of Children: 1  . Years of Education: N/A   Occupational History  . retired    Social History Main Topics  . Smoking status: Former Smoker -- 0.25 packs/day for 45 years    Types: Cigarettes    Start date: 01/08/1944  Quit date: 07/26/1989  . Smokeless tobacco: Never Used     Comment: 07/26/14 Ocasionally (socially) smoked over 25years ago- AJ  . Alcohol Use: 0.0 oz/week    0 Standard drinks or equivalent per week     Comment: 07/26/14- Drinks wine once a day everyday- AJ  . Drug Use: No  . Sexual Activity: Not on file   Other Topics Concern  . 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     Filed Vitals:   08/07/15 1519  BP: 109/66  Pulse: 62  Height:  (1.753 m)  Weight: 192 lb (87.091 kg)    PHYSICAL EXAM General: NAD HEENT: Normal. Neck: No JVD, no thyromegaly. Lungs: Clear to auscultation bilaterally with normal respiratory effort. CV: Nondisplaced PMI. Regular rate and rhythm, normal S1/faint split S2, no S3/S4, no murmur. No pretibial or periankle  edema.   Abdomen: Soft, nontender, no distention.  Neurologic: Alert and oriented.  Psych: Normal affect. Skin: Normal. Musculoskeletal: No gross deformities. Extremities: No clubbing or cyanosis.   ECG: Most recent ECG reviewed.      ASSESSMENT AND PLAN: 1. Chronic atrial fibrillation: Symptomatically stable on labetalol. Continue Eliquis 2.5 mg bid.   2. Essential HTN: Controlled on present therapy which includes hydralazine 25 mg twice daily, labetalol 300 mg daily and olmesartan 40 mg daily. No changes.  3. Pacemaker: Device interrogation on 07/16/2015 demonstrated normal device function with permanent atrial fibrillation and one high ventricular rate noted (33+ beats). Continue labetalol at present dose.  Dispo: f/u July 2017.  Prentice Docker, M.D., F.A.C.C.

## 2015-08-07 NOTE — Patient Instructions (Signed)
Continue all current medications. Your physician wants you to follow up in: 9 months.  You will receive a reminder letter in the mail one-two months in advance.  If you don't receive a letter, please call our office to schedule the follow up appointment   

## 2015-10-07 ENCOUNTER — Ambulatory Visit (INDEPENDENT_AMBULATORY_CARE_PROVIDER_SITE_OTHER): Payer: Medicare Other | Admitting: *Deleted

## 2015-10-07 DIAGNOSIS — I495 Sick sinus syndrome: Secondary | ICD-10-CM

## 2015-10-07 NOTE — Progress Notes (Signed)
Remote pacemaker transmission.   

## 2015-10-11 LAB — CUP PACEART REMOTE DEVICE CHECK
Battery Remaining Longevity: 136 mo
Battery Remaining Percentage: 91 %
Battery Voltage: 2.95 V
Date Time Interrogation Session: 20161212085800
Implantable Lead Implant Date: 20111017
Implantable Lead Location: 753860
Lead Channel Impedance Value: 660 Ohm
Lead Channel Pacing Threshold Amplitude: 0.5 V
Lead Channel Pacing Threshold Pulse Width: 0.4 ms
Lead Channel Sensing Intrinsic Amplitude: 7.2 mV
Lead Channel Setting Sensing Sensitivity: 3 mV
MDC IDC LEAD MODEL: 1948
MDC IDC PG SERIAL: 7127228
MDC IDC SET LEADCHNL RV PACING AMPLITUDE: 0.75 V
MDC IDC SET LEADCHNL RV PACING PULSEWIDTH: 0.4 ms
MDC IDC STAT BRADY RV PERCENT PACED: 98 %
Pulse Gen Model: 1210

## 2015-10-16 ENCOUNTER — Encounter: Payer: Self-pay | Admitting: Cardiology

## 2015-12-25 ENCOUNTER — Other Ambulatory Visit: Payer: Self-pay | Admitting: Cardiovascular Disease

## 2016-01-06 ENCOUNTER — Ambulatory Visit (INDEPENDENT_AMBULATORY_CARE_PROVIDER_SITE_OTHER): Payer: Medicare Other | Admitting: *Deleted

## 2016-01-06 DIAGNOSIS — I495 Sick sinus syndrome: Secondary | ICD-10-CM

## 2016-01-07 NOTE — Progress Notes (Signed)
Remote pacemaker transmission.   

## 2016-02-03 LAB — CUP PACEART REMOTE DEVICE CHECK
Battery Remaining Longevity: 137 mo
Battery Remaining Percentage: 91 %
Date Time Interrogation Session: 20170313073500
Implantable Lead Implant Date: 20111017
Implantable Lead Location: 753860
Lead Channel Impedance Value: 690 Ohm
Lead Channel Pacing Threshold Amplitude: 0.5 V
Lead Channel Pacing Threshold Pulse Width: 0.4 ms
Lead Channel Setting Pacing Pulse Width: 0.4 ms
Lead Channel Setting Sensing Sensitivity: 3 mV
MDC IDC LEAD MODEL: 1948
MDC IDC MSMT BATTERY VOLTAGE: 2.95 V
MDC IDC MSMT LEADCHNL RV SENSING INTR AMPL: 8.1 mV
MDC IDC PG SERIAL: 7127228
MDC IDC SET LEADCHNL RV PACING AMPLITUDE: 0.75 V
MDC IDC STAT BRADY RV PERCENT PACED: 97 %

## 2016-02-04 ENCOUNTER — Encounter: Payer: Self-pay | Admitting: Cardiology

## 2016-05-01 ENCOUNTER — Encounter: Payer: Medicare Other | Admitting: Internal Medicine

## 2016-05-12 ENCOUNTER — Ambulatory Visit (INDEPENDENT_AMBULATORY_CARE_PROVIDER_SITE_OTHER): Payer: Medicare Other | Admitting: Cardiovascular Disease

## 2016-05-12 ENCOUNTER — Encounter: Payer: Self-pay | Admitting: Cardiovascular Disease

## 2016-05-12 VITALS — BP 151/78 | HR 65 | Ht 69.0 in | Wt 191.0 lb

## 2016-05-12 DIAGNOSIS — I482 Chronic atrial fibrillation, unspecified: Secondary | ICD-10-CM

## 2016-05-12 DIAGNOSIS — I10 Essential (primary) hypertension: Secondary | ICD-10-CM

## 2016-05-12 DIAGNOSIS — Z950 Presence of cardiac pacemaker: Secondary | ICD-10-CM | POA: Diagnosis not present

## 2016-05-12 NOTE — Progress Notes (Signed)
Patient ID: Edwin [name], male   DOB: June 11, 1921, 82 y.o.   MRN: [id number]      SUBJECTIVE: Edwin [name] has a history of chronic atrial fibrillation for which he takes Eliquis. He has a pacemaker for a history of symptomatic bradycardia/tachy-brady syndrome and follows with Dr. Johney Frame.   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  He denies chest pain, leg swelling, palpitations, shortness of breath. He has peripheral neuropathy and denies any falls. His son and grandson help manage the business.   Soc Hx: He works 7 days a week. 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 He also manages apartments, rental properties, and storage facilities. He has a girlfriend, Edwin [name], who is also my patient. His wife passed about 12 years ago. They traveled to all 50 states.   Review of Systems: As per "subjective", otherwise negative.  No Known Allergies  Current Outpatient Prescriptions  Medication Sig Dispense Refill  . ELIQUIS 2.5 MG TABS tablet TAKE 1 TABLET BY MOUTH TWICE DAILY 60 tablet 6  . furosemide (LASIX) 40 MG tablet Take 60 mg by mouth daily.     Marland Kitchen gabapentin (NEURONTIN) 300 MG capsule Take 300 mg by mouth 2 (two) times daily.      . hydrALAZINE (APRESOLINE) 25 MG tablet Take 25 mg by mouth 2 (two) times daily.    Marland Kitchen labetalol (NORMODYNE) 200 MG tablet Take 300 mg by mouth every morning. & 200 mg by mouth at bedtime    . olmesartan (BENICAR) 40 MG tablet Take 40 mg by mouth daily.    Marland Kitchen pyridOXINE (VITAMIN B-6) 100 MG tablet Take 100 mg by mouth daily.    . Tamsulosin HCl (FLOMAX) 0.4 MG CAPS Take 0.4 mg by mouth daily.     Marland Kitchen thiamine (VITAMIN B-1) 100 MG tablet Take 100 mg by mouth daily.    . vitamin B-12 (CYANOCOBALAMIN) 100 MCG tablet Take 50 mcg by mouth daily.     No current facility-administered medications for this visit.    Past Medical History    Diagnosis Date  . Hypertension   . Hyperlipidemia   . CHF (congestive heart failure) (HCC)   . Permanent atrial fibrillation (HCC)   . Subdural hematoma (HCC)   . Peripheral neuropathy (HCC)   . Bradycardia     s/p SJM PPM by Dr Johney Frame    Past Surgical History  Procedure Laterality Date  . Pacemaker insertion  10/11    St Jude  . Craniotomy    . Cholecystectomy      Social History   Social History  . Marital Status: Widowed    Spouse Name: N/A  . Number of Children: 1  . Years of Education: N/A   Occupational History  . retired    Social History Main Topics  . Smoking status: Former Smoker -- 0.25 packs/day for 45 years    Types: Cigarettes    Start date: 01/08/1944    Quit date: 07/26/1989  . Smokeless tobacco: Never Used     Comment: 07/26/14 Ocasionally (socially) smoked over 25years ago- AJ  . Alcohol Use: 0.0 oz/week    0 Standard drinks or equivalent per week     Comment: 07/26/14- Drinks wine once a day everyday- AJ  . Drug Use: No  . Sexual Activity: Not on file   Other Topics Concern  . 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  Also active with real estate investments.     Filed Vitals:   05/12/16 1526  BP: 151/78  Pulse: 65  Height: 5\' 9"  (1.753 m)  Weight: 191 lb (86.637 kg)    PHYSICAL EXAM General: NAD HEENT: Normal. Neck: No JVD, no thyromegaly. Lungs: Clear to auscultation bilaterally with normal respiratory effort. CV: Nondisplaced PMI.  Regular rate and rhythm, normal S1/S2, no S3/S4, no murmur. No pretibial or periankle edema.     Abdomen: Soft, nontender, no distention.  Neurologic: Alert and oriented.  Psych: Normal affect. Skin: Normal. Musculoskeletal: No gross deformities.    ECG: Most recent ECG reviewed.      ASSESSMENT AND PLAN: 1. Chronic atrial fibrillation: Symptomatically stable on labetalol. Continue Eliquis 2.5 mg bid.   2. Essential HTN: Mildly elevated. No changes.  3.  Pacemaker: Normal device function. Continue labetalol at present dose.  Dispo: f/u 1 year.   Prentice DockerSuresh Koneswaran, M.D., F.A.C.C.

## 2016-05-12 NOTE — Patient Instructions (Signed)

## 2016-05-22 ENCOUNTER — Encounter: Payer: Self-pay | Admitting: Internal Medicine

## 2016-05-22 ENCOUNTER — Encounter (INDEPENDENT_AMBULATORY_CARE_PROVIDER_SITE_OTHER): Payer: Medicare Other | Admitting: Internal Medicine

## 2016-05-22 DIAGNOSIS — R0989 Other specified symptoms and signs involving the circulatory and respiratory systems: Secondary | ICD-10-CM

## 2016-06-24 ENCOUNTER — Encounter: Payer: Medicare Other | Admitting: Internal Medicine

## 2016-06-24 ENCOUNTER — Encounter: Payer: Self-pay | Admitting: Internal Medicine

## 2016-07-31 ENCOUNTER — Encounter: Payer: Self-pay | Admitting: Internal Medicine

## 2016-07-31 ENCOUNTER — Ambulatory Visit (INDEPENDENT_AMBULATORY_CARE_PROVIDER_SITE_OTHER): Payer: Medicare Other | Admitting: Internal Medicine

## 2016-07-31 VITALS — BP 152/76 | HR 62 | Ht 69.0 in | Wt 190.0 lb

## 2016-07-31 DIAGNOSIS — I10 Essential (primary) hypertension: Secondary | ICD-10-CM

## 2016-07-31 DIAGNOSIS — I495 Sick sinus syndrome: Secondary | ICD-10-CM

## 2016-07-31 DIAGNOSIS — Z950 Presence of cardiac pacemaker: Secondary | ICD-10-CM

## 2016-07-31 DIAGNOSIS — I482 Chronic atrial fibrillation, unspecified: Secondary | ICD-10-CM

## 2016-07-31 LAB — CUP PACEART INCLINIC DEVICE CHECK
Battery Remaining Longevity: 130.8
Brady Statistic RV Percent Paced: 97 %
Date Time Interrogation Session: 20171006130115
Implantable Lead Implant Date: 20111017
Implantable Lead Location: 753860
Implantable Lead Model: 1948
Lead Channel Impedance Value: 662.5 Ohm
Lead Channel Pacing Threshold Amplitude: 0.5 V
Lead Channel Pacing Threshold Pulse Width: 0.4 ms
Lead Channel Pacing Threshold Pulse Width: 0.4 ms
Lead Channel Sensing Intrinsic Amplitude: 12 mV
Lead Channel Setting Sensing Sensitivity: 3 mV
MDC IDC MSMT BATTERY VOLTAGE: 2.95 V
MDC IDC MSMT LEADCHNL RV PACING THRESHOLD AMPLITUDE: 0.5 V
MDC IDC SET LEADCHNL RV PACING AMPLITUDE: 0.75 V
MDC IDC SET LEADCHNL RV PACING PULSEWIDTH: 0.4 ms
Pulse Gen Serial Number: 7127228

## 2016-07-31 NOTE — Progress Notes (Signed)
   PCP: Edwin PeriSHAH,ASHISH, MD  Primary Cardiologist:  Edwin [name]  Edwin [name] is a 82 y.o. male who presents today for routine electrophysiology followup.  Since last being seen in our clinic, the patient reports doing very well.  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  Today, he denies symptoms of palpitations, chest pain, shortness of breath,  lower extremity edema,  presyncope, or syncope.  The patient is otherwise without complaint today.   Past Medical History:  Diagnosis Date  . Bradycardia    s/p SJM PPM by Dr Edwin [name]  . CHF (congestive heart failure) (HCC)   . Hyperlipidemia   . Hypertension   . Peripheral neuropathy (HCC)   . Permanent atrial fibrillation (HCC)   . Subdural hematoma [facility](HCC)    Past Surgical History:  Procedure Laterality Date  . CHOLECYSTECTOMY    . CRANIOTOMY    . PACEMAKER INSERTION  10/11   St Jude    Current Outpatient Prescriptions  Medication Sig Dispense Refill  . ELIQUIS 2.5 MG TABS tablet TAKE 1 TABLET BY MOUTH TWICE DAILY 60 tablet 6  . furosemide (LASIX) 40 MG tablet Take 60 mg by mouth daily.     Marland Kitchen. gabapentin (NEURONTIN) 300 MG capsule Take 300 mg by mouth 2 (two) times daily.      . hydrALAZINE (APRESOLINE) 25 MG tablet Take 25 mg by mouth 2 (two) times daily.    Marland Kitchen. labetalol (NORMODYNE) 200 MG tablet Take 300 mg by mouth every morning. & 200 mg by mouth at bedtime    . olmesartan (BENICAR) 40 MG tablet Take 40 mg by mouth daily.    Marland Kitchen. pyridOXINE (VITAMIN B-6) 100 MG tablet Take 100 mg by mouth daily.    . Tamsulosin HCl (FLOMAX) 0.4 MG CAPS Take 0.4 mg by mouth daily.     Marland Kitchen. thiamine (VITAMIN B-1) 100 MG tablet Take 100 mg by mouth daily.    . vitamin B-12 (CYANOCOBALAMIN) 100 MCG tablet Take 50 mcg by mouth daily.     No current facility-administered medications for this visit.     Physical Exam: Vitals:   07/31/16 1213  BP: (!) 152/76  Pulse: 62  SpO2: 97%  Weight: 190 lb (86.2 kg)  Height: 5\' 9"   (1.753 m)    GEN- The patient is well appearing, alert and oriented x 3 today.   Head- normocephalic, atraumatic Eyes-  Sclera clear, conjunctiva pink Ears- hearing intact Oropharynx- clear Lungs- Clear to ausculation bilaterally, normal work of breathing Chest- pacemaker pocket is well healed Heart- Regular rate and rhythm, no murmurs, rubs or gallops, PMI not laterally displaced GI- soft, NT, ND, + BS Extremities- no clubbing, cyanosis, or edema  Pacemaker interrogation- reviewed in detail today,  See PACEART report  Assessment and Plan:   BRADYCARDIA-TACHYCARDIA SYNDROME   Normal pacemaker function  See Pace Art report  No changes today   ATRIAL FIBRILLATION   Stable permanent afib, rate controlled On eliquis for stroke prevention  Merlin Return to the device clinic in 1 year  Hillis RangeJames Januel Doolan MD, [facility] HealthFACC 07/31/2016 1:07 PM

## 2016-07-31 NOTE — Patient Instructions (Signed)

## 2016-10-22 ENCOUNTER — Telehealth: Payer: Self-pay | Admitting: Internal Medicine

## 2016-10-22 NOTE — Telephone Encounter (Signed)
PATIENT WALKED IN  TRANSMITTER  Is beeping and flashing lights.  Will not work

## 2016-10-22 NOTE — Telephone Encounter (Signed)
LM for pt to returncall

## 2016-11-02 ENCOUNTER — Encounter: Payer: Medicare Other | Admitting: *Deleted

## 2016-11-02 ENCOUNTER — Telehealth: Payer: Self-pay | Admitting: Cardiology

## 2016-11-02 NOTE — Telephone Encounter (Signed)
Spoke with pt and reminded pt of remote transmission that is due today. Pt verbalized understanding.   

## 2016-11-05 NOTE — Telephone Encounter (Signed)
2nd attempt.   Attempted to call pt back. No answer and unable to leave a message.

## 2016-11-06 ENCOUNTER — Encounter: Payer: Self-pay | Admitting: Cardiology

## 2016-11-06 NOTE — Telephone Encounter (Signed)
3rd attempt  Attempted to call pt back. No answer and unable to leave a message. Will try is work phone later this afternoon.

## 2016-11-06 NOTE — Progress Notes (Signed)
Letter  

## 2016-11-09 ENCOUNTER — Encounter: Payer: Self-pay | Admitting: Cardiology

## 2016-11-09 NOTE — Telephone Encounter (Signed)
Pt is still un available by phone. I will mail letter and instructions to help him with his home monitor.

## 2016-11-19 ENCOUNTER — Ambulatory Visit (INDEPENDENT_AMBULATORY_CARE_PROVIDER_SITE_OTHER): Payer: Medicare Other | Admitting: *Deleted

## 2016-11-19 DIAGNOSIS — I495 Sick sinus syndrome: Secondary | ICD-10-CM | POA: Diagnosis not present

## 2016-11-20 ENCOUNTER — Encounter: Payer: Self-pay | Admitting: Cardiology

## 2016-11-20 NOTE — Progress Notes (Signed)
Remote pacemaker transmission.   

## 2016-11-21 LAB — CUP PACEART REMOTE DEVICE CHECK
Battery Remaining Longevity: 137 mo
Battery Remaining Percentage: 91 %
Battery Voltage: 2.95 V
Brady Statistic RV Percent Paced: 99 %
Date Time Interrogation Session: 20180125072906
Implantable Lead Location: 753860
Implantable Lead Model: 1948
Implantable Pulse Generator Implant Date: 20111017
Lead Channel Pacing Threshold Amplitude: 0.5 V
Lead Channel Sensing Intrinsic Amplitude: 12 mV
Lead Channel Setting Pacing Amplitude: 0.75 V
Lead Channel Setting Sensing Sensitivity: 3 mV
MDC IDC LEAD IMPLANT DT: 20111017
MDC IDC MSMT LEADCHNL RV IMPEDANCE VALUE: 650 Ohm
MDC IDC MSMT LEADCHNL RV PACING THRESHOLD PULSEWIDTH: 0.4 ms
MDC IDC SET LEADCHNL RV PACING PULSEWIDTH: 0.4 ms
Pulse Gen Serial Number: 7127228

## 2017-02-18 ENCOUNTER — Ambulatory Visit (INDEPENDENT_AMBULATORY_CARE_PROVIDER_SITE_OTHER): Payer: Medicare Other | Admitting: *Deleted

## 2017-02-18 DIAGNOSIS — I495 Sick sinus syndrome: Secondary | ICD-10-CM

## 2017-02-18 NOTE — Progress Notes (Signed)
Remote pacemaker transmission.   

## 2017-02-19 ENCOUNTER — Encounter: Payer: Self-pay | Admitting: Cardiology

## 2017-02-19 LAB — CUP PACEART REMOTE DEVICE CHECK
Battery Remaining Percentage: 91 %
Battery Voltage: 2.95 V
Brady Statistic RV Percent Paced: 99 %
Date Time Interrogation Session: 20180426060057
Implantable Lead Implant Date: 20111017
Implantable Lead Location: 753860
Lead Channel Impedance Value: 640 Ohm
Lead Channel Pacing Threshold Pulse Width: 0.4 ms
Lead Channel Sensing Intrinsic Amplitude: 7.6 mV
Lead Channel Setting Pacing Amplitude: 0.75 V
Lead Channel Setting Pacing Pulse Width: 0.4 ms
MDC IDC MSMT BATTERY REMAINING LONGEVITY: 136 mo
MDC IDC MSMT LEADCHNL RV PACING THRESHOLD AMPLITUDE: 0.5 V
MDC IDC PG IMPLANT DT: 20111017
MDC IDC SET LEADCHNL RV SENSING SENSITIVITY: 3 mV
Pulse Gen Model: 1210
Pulse Gen Serial Number: 7127228

## 2017-05-20 ENCOUNTER — Ambulatory Visit (INDEPENDENT_AMBULATORY_CARE_PROVIDER_SITE_OTHER): Payer: Medicare Other | Admitting: *Deleted

## 2017-05-20 DIAGNOSIS — I495 Sick sinus syndrome: Secondary | ICD-10-CM | POA: Diagnosis not present

## 2017-05-20 NOTE — Progress Notes (Signed)
Remote pacemaker transmission.   

## 2017-05-21 ENCOUNTER — Encounter: Payer: Self-pay | Admitting: Cardiology

## 2017-05-21 ENCOUNTER — Encounter: Payer: Self-pay | Admitting: *Deleted

## 2017-05-24 ENCOUNTER — Ambulatory Visit: Payer: Medicare Other | Admitting: Cardiovascular Disease

## 2017-06-02 ENCOUNTER — Ambulatory Visit (INDEPENDENT_AMBULATORY_CARE_PROVIDER_SITE_OTHER): Payer: Medicare Other | Admitting: Cardiovascular Disease

## 2017-06-02 VITALS — BP 112/62 | HR 60 | Ht 69.0 in | Wt 196.0 lb

## 2017-06-02 DIAGNOSIS — I482 Chronic atrial fibrillation, unspecified: Secondary | ICD-10-CM

## 2017-06-02 DIAGNOSIS — Z950 Presence of cardiac pacemaker: Secondary | ICD-10-CM

## 2017-06-02 DIAGNOSIS — I10 Essential (primary) hypertension: Secondary | ICD-10-CM

## 2017-06-03 NOTE — Patient Instructions (Signed)

## 2017-06-03 NOTE — Progress Notes (Signed)
SUBJECTIVE: [name] has a history of chronic atrial fibrillation for which he takes Eliquis. He has a pacemaker for a history of symptomatic bradycardia/tachy-brady syndrome and follows with Edwin. Edwin [name].   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   He denies shortness of breath. He also denies chest pain, lightheadedness, and dizziness. He has chronic left leg swelling but denies leg pain. He has balance problems but denies any recent falls.  ECG performed in the office today which I ordered an personally interpreted demonstrated a ventricular paced rhythm.   Review of Systems: As per "subjective", otherwise negative.  No Known Allergies  Current Outpatient Prescriptions  Medication Sig Dispense Refill  . ELIQUIS 2.5 MG TABS tablet TAKE 1 TABLET BY MOUTH TWICE DAILY 60 tablet 6  . furosemide (LASIX) 40 MG tablet Take 60 mg by mouth daily.     Marland Kitchen gabapentin (NEURONTIN) 300 MG capsule Take 300 mg by mouth 2 (two) times daily.      . hydrALAZINE (APRESOLINE) 25 MG tablet Take 25 mg by mouth 2 (two) times daily.    Marland Kitchen labetalol (NORMODYNE) 200 MG tablet Take 300 mg by mouth every morning. & 200 mg by mouth at bedtime    . olmesartan (BENICAR) 40 MG tablet Take 40 mg by mouth daily.    Marland Kitchen pyridOXINE (VITAMIN B-6) 100 MG tablet Take 100 mg by mouth daily.    . Tamsulosin HCl (FLOMAX) 0.4 MG CAPS Take 0.4 mg by mouth daily.     Marland Kitchen thiamine (VITAMIN B-1) 100 MG tablet Take 100 mg by mouth daily.    . vitamin B-12 (CYANOCOBALAMIN) 100 MCG tablet Take 50 mcg by mouth daily.     No current facility-administered medications for this visit.     Past Medical History:  Diagnosis Date  . Bradycardia    s/p SJM PPM by Edwin [name]  . CHF (congestive heart failure) (HCC)   . Hyperlipidemia   . Hypertension   . Peripheral neuropathy   . Permanent atrial fibrillation (HCC)   . Subdural hematoma [facility])     Past Surgical History:  Procedure Laterality Date  . CHOLECYSTECTOMY      . CRANIOTOMY    . PACEMAKER INSERTION  10/11   [facility]    Social History   Social History  . Marital status: Widowed    Spouse name: N/A  . Number of children: 1  . Years of education: N/A   Occupational History  . retired    Social History Main Topics  . Smoking status: Former Smoker    Packs/day: 0.25    Years: 45.00    Types: Cigarettes    Start date: 01/08/1944    Quit date: 07/26/1989  . Smokeless tobacco: Never Used     Comment: 07/26/14 Ocasionally (socially) smoked over 25years ago- AJ  . Alcohol use 0.0 oz/week     Comment: 07/26/14- Drinks wine once a day everyday- AJ  . Drug use: No  . Sexual activity: Not on file   Other Topics Concern  . 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     Vitals:   06/03/17 0715  BP: 112/62  Pulse: 60  SpO2: 98%  Weight: 196 lb (88.9 kg)  Height: 5\' 9"  (1.753 m)    Wt Readings from Last 3 Encounters:  06/03/17 196 lb (88.9 kg)  07/31/16 190 lb (86.2 kg)  05/12/16 191 lb (86.6 kg)     PHYSICAL EXAM General: NAD HEENT: Normal. Neck: No JVD, no thyromegaly. Lungs: Clear to auscultation bilaterally with normal respiratory effort. CV: Nondisplaced PMI.  Regular rate and rhythm, normal S1/S2, no S3/S4, no murmur. Chronic left leg swelling.  No carotid bruit.   Abdomen: Soft, nontender, no distention.  Neurologic: Alert and oriented.  Psych: Normal affect. Skin: Normal. Musculoskeletal: No gross deformities.    ECG: Most recent ECG reviewed.   Labs: Lab Results  Component Value Date/Time   K 3.6 08/12/2010 03:33 AM   BUN 10 08/12/2010 03:33 AM   CREATININE 0.91 08/12/2010 03:33 AM   HGB 12.2 (L) 08/11/2010 10:55 AM     Lipids: No results found for: LDLCALC, LDLDIRECT, CHOL, TRIG, HDL     ASSESSMENT AND PLAN:  1. Chronic atrial fibrillation: Symptomatically stable on labetalol. Continue Eliquis 2.5 mg bid.   2. Essential HTN:  Controlled. No changes.  3. Pacemaker: Normal device function. Continue labetalol at present dose.      Disposition: Follow up 1 year.   Edwin [name], M.D., F.A.C.C.

## 2017-06-17 LAB — CUP PACEART REMOTE DEVICE CHECK
Battery Remaining Longevity: 122 mo
Battery Remaining Percentage: 81 %
Battery Voltage: 2.93 V
Implantable Lead Implant Date: 20111017
Implantable Lead Location: 753860
Implantable Pulse Generator Implant Date: 20111017
Lead Channel Impedance Value: 640 Ohm
Lead Channel Pacing Threshold Pulse Width: 0.4 ms
Lead Channel Setting Pacing Pulse Width: 0.4 ms
MDC IDC MSMT LEADCHNL RV PACING THRESHOLD AMPLITUDE: 0.375 V
MDC IDC MSMT LEADCHNL RV SENSING INTR AMPL: 11.2 mV
MDC IDC PG SERIAL: 7127228
MDC IDC SESS DTM: 20180726060008
MDC IDC SET LEADCHNL RV PACING AMPLITUDE: 0.625
MDC IDC SET LEADCHNL RV SENSING SENSITIVITY: 3 mV
MDC IDC STAT BRADY RV PERCENT PACED: 98 %
Pulse Gen Model: 1210

## 2017-07-30 ENCOUNTER — Ambulatory Visit (INDEPENDENT_AMBULATORY_CARE_PROVIDER_SITE_OTHER): Payer: Medicare Other | Admitting: Internal Medicine

## 2017-07-30 ENCOUNTER — Encounter: Payer: Self-pay | Admitting: Internal Medicine

## 2017-07-30 VITALS — BP 140/72 | HR 78 | Ht 69.0 in | Wt 188.0 lb

## 2017-07-30 DIAGNOSIS — I442 Atrioventricular block, complete: Secondary | ICD-10-CM | POA: Diagnosis not present

## 2017-07-30 DIAGNOSIS — I482 Chronic atrial fibrillation, unspecified: Secondary | ICD-10-CM

## 2017-07-30 NOTE — Patient Instructions (Addendum)
Medication Instructions:  Continue all current medications.  Labwork: none  Testing/Procedures: none  Follow-Up: Your physician wants you to follow up in:  1 year.  You will receive a reminder letter in the mail one-two months in advance.  If you don't receive a letter, please call our office to schedule the follow up appointment   Any Other Special Instructions Will Be Listed Below (If Applicable). Remote monitoring is used to monitor your Pacemaker of ICD from home. This monitoring reduces the number of office visits required to check your device to one time per year. It allows [place] to keep an eye on the functioning of your device to ensure it is working properly. You are scheduled for a device check from home on 08-19-2017. You may send your transmission at any time that day. If you have a wireless device, the transmission will be sent automatically. After your physician reviews your transmission, you will receive a postcard with your next transmission date.  If you need a refill on your cardiac medications before your next appointment, please call your pharmacy.

## 2017-07-30 NOTE — Progress Notes (Signed)
    PCP: Kirstie Peri, MD Primary Cardiologist:  Dr Purvis Sheffield Primary EP:  Dr Delrae Rend is a 83 y.o. male who presents today for routine electrophysiology followup.  Since last being seen in our clinic, the patient reports doing very well. His sister died last week and he is mourning.  Today, he denies symptoms of palpitations, chest pain, shortness of breath,  lower extremity edema, dizziness, presyncope, or syncope.  The patient is otherwise without complaint today.   Past Medical History:  Diagnosis Date  . Bradycardia    s/p SJM PPM by Dr Johney Frame  . CHF (congestive heart failure) (HCC)   . Hyperlipidemia   . Hypertension   . Peripheral neuropathy   . Permanent atrial fibrillation (HCC)   . Subdural hematoma [facility])    Past Surgical History:  Procedure Laterality Date  . CHOLECYSTECTOMY    . CRANIOTOMY    . PACEMAKER INSERTION  10/11   [facility] Jude    ROS- all systems are reviewed and negative except as per HPI above  Current Outpatient Prescriptions  Medication Sig Dispense Refill  . ELIQUIS 2.5 MG TABS tablet TAKE 1 TABLET BY MOUTH TWICE DAILY 60 tablet 6  . furosemide (LASIX) 40 MG tablet Take 60 mg by mouth daily.     Marland Kitchen gabapentin (NEURONTIN) 300 MG capsule Take 300 mg by mouth 2 (two) times daily.      . hydrALAZINE (APRESOLINE) 25 MG tablet Take 25 mg by mouth 2 (two) times daily.    Marland Kitchen labetalol (NORMODYNE) 200 MG tablet Take 300 mg by mouth every morning. & 200 mg by mouth at bedtime    . olmesartan (BENICAR) 40 MG tablet Take 40 mg by mouth daily.    Marland Kitchen pyridOXINE (VITAMIN B-6) 100 MG tablet Take 100 mg by mouth daily.    . Tamsulosin HCl (FLOMAX) 0.4 MG CAPS Take 0.4 mg by mouth daily.     Marland Kitchen thiamine (VITAMIN B-1) 100 MG tablet Take 100 mg by mouth daily.    . vitamin B-12 (CYANOCOBALAMIN) 100 MCG tablet Take 50 mcg by mouth daily.     No current facility-administered medications for this visit.     Physical Exam: Vitals:   07/30/17 1109  BP: 140/72   Pulse: 78  SpO2: 96%  Weight: 188 lb (85.3 kg)  Height:  (1.753 m)    GEN- The patient is well appearing, alert and oriented x 3 today.   Head- normocephalic, atraumatic Eyes-  Sclera clear, conjunctiva pink Ears- hearing intact Oropharynx- clear Lungs- Clear to ausculation bilaterally, normal work of breathing Chest- pacemaker pocket is well healed Heart- Regular rate and rhythm, no murmurs, rubs or gallops, PMI not laterally displaced GI- soft, NT, ND, + BS Extremities- no clubbing, cyanosis, or edema  Pacemaker interrogation- reviewed in detail today,  See PACEART report   Assessment and Plan:  1. Symptomatic complete heart block Normal pacemaker function See Pace Art report No changes today  2. Atrial fibrillation (permanent) Rate controlled Continue eliquis  3. HTN Stable No change required today  Merlin Return to the device clinic in 1 year  Hillis Range MD, [facility] 07/30/2017 11:16 AM

## 2017-08-02 LAB — CUP PACEART INCLINIC DEVICE CHECK
Battery Remaining Longevity: 130 mo
Date Time Interrogation Session: 20181005152638
Implantable Lead Implant Date: 20111017
Implantable Lead Model: 1948
Implantable Pulse Generator Implant Date: 20111017
Lead Channel Pacing Threshold Amplitude: 0.5 V
Lead Channel Pacing Threshold Pulse Width: 0.4 ms
Lead Channel Sensing Intrinsic Amplitude: 12 mV
MDC IDC LEAD LOCATION: 753860
MDC IDC MSMT BATTERY VOLTAGE: 2.95 V
MDC IDC MSMT LEADCHNL RV IMPEDANCE VALUE: 587.5 Ohm
MDC IDC PG SERIAL: 7127228
MDC IDC SET LEADCHNL RV PACING AMPLITUDE: 0.75 V
MDC IDC SET LEADCHNL RV PACING PULSEWIDTH: 0.4 ms
MDC IDC SET LEADCHNL RV SENSING SENSITIVITY: 3 mV
MDC IDC STAT BRADY RV PERCENT PACED: 98 %

## 2017-08-19 ENCOUNTER — Encounter: Payer: Medicare Other | Admitting: *Deleted

## 2017-08-19 ENCOUNTER — Telehealth: Payer: Self-pay | Admitting: Cardiology

## 2017-08-19 NOTE — Telephone Encounter (Signed)
Confirmed remote transmission w/ pt caregiver.   

## 2017-08-20 ENCOUNTER — Encounter: Payer: Self-pay | Admitting: Cardiology

## 2018-02-02 ENCOUNTER — Other Ambulatory Visit: Payer: Self-pay

## 2018-02-02 DIAGNOSIS — M7989 Other specified soft tissue disorders: Secondary | ICD-10-CM

## 2018-02-08 ENCOUNTER — Encounter: Payer: Self-pay | Admitting: Vascular Surgery

## 2018-02-08 ENCOUNTER — Ambulatory Visit: Payer: Medicare Other | Admitting: Vascular Surgery

## 2018-02-08 ENCOUNTER — Ambulatory Visit (HOSPITAL_COMMUNITY)
Admission: RE | Admit: 2018-02-08 | Discharge: 2018-02-08 | Disposition: A | Discharge status: Home / Self Care | Payer: Medicare Other | Source: Ambulatory Visit | Attending: Vascular Surgery | Admitting: Vascular Surgery

## 2018-02-08 ENCOUNTER — Other Ambulatory Visit: Payer: Self-pay

## 2018-02-08 VITALS — BP 137/76 | HR 65 | Temp 96.7°F | Resp 18 | Ht 69.0 in | Wt 196.0 lb

## 2018-02-08 DIAGNOSIS — R600 Localized edema: Secondary | ICD-10-CM | POA: Diagnosis not present

## 2018-02-08 DIAGNOSIS — M7989 Other specified soft tissue disorders: Secondary | ICD-10-CM | POA: Diagnosis not present

## 2018-02-08 NOTE — Progress Notes (Signed)
Subjective:     Patient ID: Edwin [name], male   DOB: 1921-05-01, 84 y.o.   MRN: [id number]  HPI This 84 year old male was referred by Dr. Sherryll BurgerShah for evaluation of erythema and swelling in the left lower extremity.  The patient states that he injured his left leg just below the knee when it was struck by a piece of wood.  He had no external bleeding.  Over the next several days he developed redness and swelling in the right lower extremity from the mid thigh distally.  He was seen and started on antibiotics.  He has had 2 courses of antibiotics.  He denies any chills and fever.  He has no history of DVT thrombophlebitis stasis ulcers or bleeding.  It is unclear if he has had an x-ray of the left lower extremity.  He was referred for evaluation of his vascular system to see if this is contributory.  Past Medical History:  Diagnosis Date  . Bradycardia    s/p SJM PPM by Dr Johney FrameAllred  . CHF (congestive heart failure) (HCC)   . Hyperlipidemia   . Hypertension   . Peripheral neuropathy   . Permanent atrial fibrillation (HCC)   . Subdural hematoma (HCC)     Social History   Tobacco Use  . Smoking status: Former Smoker    Packs/day: 0.25    Years: 45.00    Pack years: 11.25    Types: Cigarettes    Start date: 01/08/1944    Last attempt to quit: 07/26/1989    Years since quitting: 28.5  . Smokeless tobacco: Never Used  . Tobacco comment: 07/26/14 Ocasionally (socially) smoked over 25years ago- AJ  Substance Use Topics  . Alcohol use: Yes    Alcohol/week: 0.0 oz    Comment: 07/26/14- Drinks wine once a day everyday- AJ    Family History  Problem Relation Age of Onset  . Heart failure Mother   . Other Father [id number]       typhoid    No Known Allergies   Current Outpatient Medications:  .  ELIQUIS 2.5 MG TABS tablet, TAKE 1 TABLET BY MOUTH TWICE DAILY, Disp: 60 tablet, Rfl: 6 .  hydrALAZINE (APRESOLINE) 25 MG tablet, Take 25 mg by mouth 2 (two) times daily., Disp: , Rfl:  .  labetalol  (NORMODYNE) 200 MG tablet, Take 300 mg by mouth every morning. & 200 mg by mouth at bedtime, Disp: , Rfl:  .  olmesartan (BENICAR) 40 MG tablet, Take 40 mg by mouth daily., Disp: , Rfl:  .  pyridOXINE (VITAMIN B-6) 100 MG tablet, Take 100 mg by mouth daily., Disp: , Rfl:  .  Tamsulosin HCl (FLOMAX) 0.4 MG CAPS, Take 0.4 mg by mouth daily. , Disp: , Rfl:  .  thiamine (VITAMIN B-1) 100 MG tablet, Take 100 mg by mouth daily., Disp: , Rfl:  .  vitamin B-12 (CYANOCOBALAMIN) 100 MCG tablet, Take 50 mcg by mouth daily., Disp: , Rfl:  .  furosemide (LASIX) 40 MG tablet, Take 60 mg by mouth daily. , Disp: , Rfl:  .  gabapentin (NEURONTIN) 300 MG capsule, Take 300 mg by mouth 2 (two) times daily.  , Disp: , Rfl:   Vitals:   02/08/18 1420  BP: 137/76  Pulse: 65  Resp: 18  Temp: (!) 96.7 F (35.9 C)  TempSrc: Oral  SpO2: 98%  Weight: 196 lb (88.9 kg)  Height: 5\' 9"  (1.753 m)    Body mass index is 28.94 kg/m.  Review of Systems Denies chest pain.  Has history of A. fib and is on chronic anticoagulation with Eliquis.  Patient also takes diuretics including Lasix 60 mg daily.  Also history of hypertension, mitral regurgitation, subdural hematoma.    Objective:   Physical Exam BP 137/76 (BP Location: Right Arm, Patient Position: Sitting, Cuff Size: Normal)   Pulse 65   Temp (!) 96.7 F (35.9 C) (Oral)   Resp 18   Ht 5\' 9"  (1.753 m)   Wt 196 lb (88.9 kg)   SpO2 98%   BMI 28.94 kg/m     Gen.-alert and oriented x3 in no apparent distress elderly and frail HEENT normal for age Lungs no rhonchi or wheezing Cardiovascular regular rhythm no murmurs carotid pulses 3+ palpable no bruits audible Abdomen soft nontender no palpable masses-obese Musculoskeletal free of  major deformities Skin clear-left leg with erythema extending from distal thigh to foot.  1-2+ edema throughout mostly below the knee.  Area of contusion over about a 4 cm in diameter area just below the knee medially  over the great saphenous system.  No bulging varicosities or hyperpigmentation noted.  Excellent Doppler flow in bilateral AT and PT's-biphasic Neurologic normal Lower extremities 3+ femoral and dorsalis pedis pulses palpable bilaterally   Today I ordered a venous duplex exam of both legs which are reviewed and interpreted.  Left leg has no evidence of DVT or superficial venous thrombosis.  The left great saphenous vein does have mild reflux in the calf but is of small caliber  I performed a bedside SonoSite ultrasound exam today confirming the above findings      Assessment:     Swelling and erythema left distal thigh and calf following contusion left lower leg about 4 weeks ago No evidence of DVT or superficial venous thrombosis or superficial venous reflux to account for these findings No evidence of arterial insufficiency Suspect low-grade cellulitis due to contusion left lower leg    Plan:     If x-ray of left lower extremity has not been performed would recommend that to rule out low-grade fracture Because the edema extends into the mid thigh could consider CT scan of the abdomen and pelvis if this does not resolve to be certain there is no extrinsic pressure in this area although patient does not have a previous history of edema prior to this traumatic event No indication for any arterial or venous intervention or further evaluation May need adjustment in antibiotics

## 2018-03-01 ENCOUNTER — Other Ambulatory Visit: Payer: Self-pay | Admitting: Otolaryngology

## 2018-03-16 ENCOUNTER — Encounter (HOSPITAL_COMMUNITY): Admission: RE | Payer: Self-pay | Source: Ambulatory Visit

## 2018-03-16 ENCOUNTER — Ambulatory Visit (HOSPITAL_COMMUNITY): Admission: RE | Admit: 2018-03-16 | Payer: Medicare Other | Source: Ambulatory Visit | Admitting: Otolaryngology

## 2018-03-16 SURGERY — MINOR EXCISION OF LESION
Anesthesia: General | Laterality: Left

## 2018-04-01 ENCOUNTER — Encounter: Payer: Self-pay | Admitting: *Deleted

## 2018-04-01 ENCOUNTER — Encounter: Payer: Self-pay | Admitting: Cardiovascular Disease

## 2018-04-01 ENCOUNTER — Ambulatory Visit (INDEPENDENT_AMBULATORY_CARE_PROVIDER_SITE_OTHER): Payer: Medicare Other | Admitting: Cardiovascular Disease

## 2018-04-01 ENCOUNTER — Telehealth: Payer: Self-pay | Admitting: Cardiovascular Disease

## 2018-04-01 ENCOUNTER — Other Ambulatory Visit: Payer: Self-pay | Admitting: *Deleted

## 2018-04-01 VITALS — BP 90/48 | HR 60 | Ht 69.0 in | Wt 190.0 lb

## 2018-04-01 DIAGNOSIS — R600 Localized edema: Secondary | ICD-10-CM

## 2018-04-01 DIAGNOSIS — R300 Dysuria: Secondary | ICD-10-CM

## 2018-04-01 DIAGNOSIS — I482 Chronic atrial fibrillation: Secondary | ICD-10-CM | POA: Diagnosis not present

## 2018-04-01 DIAGNOSIS — I9589 Other hypotension: Secondary | ICD-10-CM

## 2018-04-01 DIAGNOSIS — Z9289 Personal history of other medical treatment: Secondary | ICD-10-CM

## 2018-04-01 DIAGNOSIS — I10 Essential (primary) hypertension: Secondary | ICD-10-CM | POA: Diagnosis not present

## 2018-04-01 DIAGNOSIS — I4821 Permanent atrial fibrillation: Secondary | ICD-10-CM

## 2018-04-01 DIAGNOSIS — Z950 Presence of cardiac pacemaker: Secondary | ICD-10-CM

## 2018-04-01 MED ORDER — TORSEMIDE 20 MG PO TABS: 20.0000 mg | ORAL_TABLET | Freq: Two times a day (BID) | ORAL | 1 refills | Status: DC

## 2018-04-01 MED ORDER — POTASSIUM CHLORIDE CRYS ER 20 MEQ PO TBCR: 20.0000 meq | EXTENDED_RELEASE_TABLET | Freq: Two times a day (BID) | ORAL | 1 refills | Status: DC

## 2018-04-01 NOTE — Patient Instructions (Addendum)
Medication Instructions:   Your physician has recommended you make the following change in your medication:   Stop furosemide.  Stop hydralazine.  Start torsemide 20 mg by mouth twice daily.  Increase potassium chloride to 20 meq by mouth twice daily.  Labwork:  Your physician recommends that you return for lab work today to check your CBC w/diff, CMET & urinalysis.  Testing/Procedures: Your physician has requested that you have an echocardiogram. Echocardiography is a painless test that uses sound waves to create images of your heart. It provides your doctor with information about the size and shape of your heart and how well your heart's chambers and valves are working. This procedure takes approximately one hour. There are no restrictions for this procedure.  Follow-Up:  Your physician recommends that you schedule a follow-up appointment in: 1 week with an extender.  Any Other Special Instructions Will Be Listed Below (If Applicable).  If you need a refill on your cardiac medications before your next appointment, please call your pharmacy.

## 2018-04-01 NOTE — Telephone Encounter (Signed)
Pre-cert Verification for the following procedure   [facility]

## 2018-04-01 NOTE — Progress Notes (Signed)
SUBJECTIVE: Edwin [name] has a history of permanent atrial fibrillation for which he takes Eliquis. He has a pacemaker for a history of symptomatic bradycardia/tachy-brady syndrome and follows with Dr. Johney Frame.   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  Much has changed with his health since I last saw him.  He is here with his sitter.  He apparently fell and developed a right leg hematoma which required surgical evacuation performed at [facility].  I do not have any of these records and will have to request them.  His sitter says that his condition has changed since being discharged 1 week ago.  He has developed considerable bilateral leg edema.  His right leg is bandaged.  He underwent lower extremity Dopplers earlier today at [facility] and these results are presently unavailable.  He denies chest pain.  His sitter says he is sometimes confused and mumbles.  He denies fevers.  He is currently on Lasix 60 mg twice daily but his sitter says he has not been making much urine.  He has relative hypotension in our office, 90/48.  Recheck of blood pressure was 120/68.  I am told that the nurse has checked his blood pressure recently at home and it was 130/70.  He does not have a recent echocardiogram.    Review of Systems: As per "subjective", otherwise negative.  No Known Allergies  Current Outpatient Medications  Medication Sig Dispense Refill  . amoxicillin-clavulanate (AUGMENTIN) 500-125 MG tablet Take 1 tablet by mouth 2 (two) times daily.    Marland Kitchen ELIQUIS 2.5 MG TABS tablet TAKE 1 TABLET BY MOUTH TWICE DAILY 60 tablet 6  . furosemide (LASIX) 40 MG tablet Take 40 mg by mouth 2 (two) times daily.     Marland Kitchen gabapentin (NEURONTIN) 300 MG capsule Take 300 mg by mouth 2 (two) times daily.      . hydrALAZINE (APRESOLINE) 25 MG tablet Take 25 mg by mouth 2 (two) times daily.    Marland Kitchen labetalol (NORMODYNE) 200 MG tablet Take 300 mg by mouth every morning. &  200 mg by mouth at bedtime    . losartan (COZAAR) 100 MG tablet Take 100 mg by mouth daily.    . potassium chloride (K-DUR,KLOR-CON) 10 MEQ tablet Take 10 mEq by mouth daily.    Marland Kitchen pyridOXINE (VITAMIN B-6) 100 MG tablet Take 100 mg by mouth daily.    . Tamsulosin HCl (FLOMAX) 0.4 MG CAPS Take 0.4 mg by mouth daily.     Marland Kitchen thiamine (VITAMIN B-1) 100 MG tablet Take 100 mg by mouth daily.    . vitamin B-12 (CYANOCOBALAMIN) 100 MCG tablet Take 50 mcg by mouth daily.     No current facility-administered medications for this visit.     Past Medical History:  Diagnosis Date  . Bradycardia    s/p SJM PPM by Dr Johney Frame  . CHF (congestive heart failure) (HCC)   . Hyperlipidemia   . Hypertension   . Peripheral neuropathy   . Permanent atrial fibrillation (HCC)   . Subdural hematoma [facility])     Past Surgical History:  Procedure Laterality Date  . CHOLECYSTECTOMY    . CRANIOTOMY    . PACEMAKER INSERTION  10/11   [facility]    Social History   Socioeconomic History  . Marital status: Widowed    Spouse name: Not on file  . Number of children: 1  . Years of education: Not  on file  . Highest education level: Not on file  Occupational History  . Occupation: retired  Engineer, productionocial Needs  . Financial resource strain: Not on file  . Food insecurity:    Worry: Not on file    Inability: Not on file  . Transportation needs:    Medical: Not on file    Non-medical: Not on file  Tobacco Use  . Smoking status: Former Smoker    Packs/day: 0.25    Years: 45.00    Pack years: 11.25    Types: Cigarettes    Start date: 01/08/1944    Last attempt to quit: 07/26/1989    Years since quitting: 28.7  . Smokeless tobacco: Never Used  . Tobacco comment: 07/26/14 Ocasionally (socially) smoked over 25years ago- AJ  Substance and Sexual Activity  . Alcohol use: Yes    Alcohol/week: 0.0 oz    Comment: 07/26/14- Drinks wine once a day everyday- AJ  . Drug use: No  . Sexual activity: Not on file  Lifestyle  .  Physical activity:    Days per week: Not on file    Minutes per session: Not on file  . Stress: Not on file  Relationships  . Social connections:    Talks on phone: Not on file    Gets together: Not on file    Attends religious service: Not on file    Active member of club or organization: Not on file    Attends meetings of clubs or organizations: Not on file    Relationship status: Not on file  . Intimate partner violence:    Fear of current or ex partner: Not on file    Emotionally abused: Not on file    Physically abused: Not on file    Forced sexual activity: Not on file  Other Topics Concern  . 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     Vitals:   04/01/18 1532  BP: (!) 90/48  Pulse: 60  SpO2: 98%  Weight: 190 lb (86.2 kg)  Height: 5\' 9"  (1.753 m)    Wt Readings from Last 3 Encounters:  04/01/18 190 lb (86.2 kg)  02/08/18 196 lb (88.9 kg)  07/30/17 188 lb (85.3 kg)     PHYSICAL EXAM General: NAD, sometimes mumbling HEENT: Normal. Neck: No JVD, no thyromegaly. Lungs: Clear to auscultation bilaterally with normal respiratory effort. CV: Regular rate and rhythm, normal S1/S2, no S3/S4, no murmur.  2+ pitting bilateral leg edema.  The right leg is bandaged.  There is no tenderness to palpation. Abdomen: Soft, nontender, no distention.  Neurologic: Alert.  Follows commands appropriately. Psych: Normal affect. Skin: Normal. Musculoskeletal: No gross deformities.    ECG: Most recent ECG reviewed.   Labs: Lab Results  Component Value Date/Time   K 3.6 08/12/2010 03:33 AM   BUN 10 08/12/2010 03:33 AM   CREATININE 0.91 08/12/2010 03:33 AM   HGB 12.2 (L) 08/11/2010 10:55 AM     Lipids: No results found for: LDLCALC, LDLDIRECT, CHOL, TRIG, HDL     ASSESSMENT AND PLAN: 1.  Bilateral leg edema: I will obtain an echocardiogram to evaluate cardiac structure and function.  I am  switching Lasix to torsemide 20 mg twice daily and increasing potassium to 20 meq and twice daily.  I will obtain a comprehensive metabolic panel. [facility].  2.  Hypotension with dysuria: I am concerned that he may have developed an infection.  I will obtain a CBC with differential, urinalysis, and comprehensive metabolic panel.  I will stop hydralazine for now.  Recheck of blood pressure was 120/68.  Blood pressure was reportedly normal within the past several days.  I told his sitter that should his symptoms progress, he should be taken to the hospital.  3. Permanent atrial fibrillation: Heart rate is controlled on labetalol. Continue Eliquis 2.5 mg bid.   4. Pacemaker: Normal device function. Continue labetalol at present dose.     Disposition: Follow up 1 week  Time spent: 40 minutes, of which greater than 50% was spent reviewing symptoms, relevant blood tests and studies, and discussing management plan with the patient.    Prentice Docker, M.D., F.A.C.C.

## 2018-04-04 ENCOUNTER — Ambulatory Visit (HOSPITAL_COMMUNITY): Admission: RE | Admit: 2018-04-04 | Payer: Medicare Other | Source: Ambulatory Visit

## 2018-04-08 ENCOUNTER — Encounter: Payer: Self-pay | Admitting: *Deleted

## 2018-04-22 ENCOUNTER — Other Ambulatory Visit: Payer: Self-pay | Admitting: *Deleted

## 2018-04-22 DIAGNOSIS — R600 Localized edema: Secondary | ICD-10-CM

## 2018-04-27 ENCOUNTER — Encounter: Payer: Self-pay | Admitting: *Deleted

## 2018-05-03 ENCOUNTER — Other Ambulatory Visit: Payer: Self-pay | Admitting: Cardiovascular Disease

## 2018-05-03 DIAGNOSIS — I4891 Unspecified atrial fibrillation: Secondary | ICD-10-CM

## 2018-05-03 DIAGNOSIS — R600 Localized edema: Secondary | ICD-10-CM

## 2018-05-19 ENCOUNTER — Other Ambulatory Visit: Payer: Self-pay

## 2018-05-19 ENCOUNTER — Ambulatory Visit (INDEPENDENT_AMBULATORY_CARE_PROVIDER_SITE_OTHER): Payer: Medicare Other

## 2018-05-19 DIAGNOSIS — I4891 Unspecified atrial fibrillation: Secondary | ICD-10-CM | POA: Diagnosis not present

## 2018-05-19 DIAGNOSIS — R600 Localized edema: Secondary | ICD-10-CM | POA: Diagnosis not present

## 2018-05-30 ENCOUNTER — Telehealth: Payer: Self-pay | Admitting: *Deleted

## 2018-05-30 NOTE — Telephone Encounter (Signed)
-----   Message from Norva PavlovKailey Joyce, LPN sent at [id number] 10:08 AM EDT -----   ----- Message ----- From: Antoine PocheBranch, Jonathan F, MD Sent: 05/23/2018   1:44 PM To: Norva PavlovKailey Joyce, LPN  Echo shows normal heart pumping function. There is some evidence of age related stiffness of the heart that can lead to swelling at times. Otherwise heart function is fine   Dominga FerryJ Branch MD

## 2018-05-30 NOTE — Telephone Encounter (Signed)
Pt aware - routed to pcp  

## 2018-07-29 ENCOUNTER — Encounter: Payer: Medicare Other | Admitting: Internal Medicine

## 2018-10-31 ENCOUNTER — Ambulatory Visit (INDEPENDENT_AMBULATORY_CARE_PROVIDER_SITE_OTHER): Payer: Medicare Other

## 2018-10-31 DIAGNOSIS — I495 Sick sinus syndrome: Secondary | ICD-10-CM

## 2018-10-31 DIAGNOSIS — I442 Atrioventricular block, complete: Secondary | ICD-10-CM

## 2018-11-01 LAB — CUP PACEART REMOTE DEVICE CHECK
Battery Remaining Longevity: 122 mo
Battery Remaining Percentage: 81 %
Brady Statistic RV Percent Paced: 97 %
Implantable Lead Implant Date: 20111017
Implantable Lead Model: 1948
Implantable Pulse Generator Implant Date: 20111017
Lead Channel Impedance Value: 630 Ohm
Lead Channel Pacing Threshold Amplitude: 0.5 V
Lead Channel Pacing Threshold Pulse Width: 0.4 ms
Lead Channel Sensing Intrinsic Amplitude: 12 mV
Lead Channel Setting Sensing Sensitivity: 3 mV
MDC IDC LEAD LOCATION: 753860
MDC IDC MSMT BATTERY VOLTAGE: 2.93 V
MDC IDC PG SERIAL: 7127228
MDC IDC SESS DTM: 20200106074622
MDC IDC SET LEADCHNL RV PACING AMPLITUDE: 0.75 V
MDC IDC SET LEADCHNL RV PACING PULSEWIDTH: 0.4 ms

## 2018-11-01 NOTE — Progress Notes (Signed)
Remote pacemaker transmission.   

## 2018-11-03 ENCOUNTER — Telehealth: Payer: Self-pay | Admitting: *Deleted

## 2018-11-03 NOTE — Telephone Encounter (Signed)
Received alert for 6 HVRs noted on PPM on 11/02/18. Called patient to discuss any symptoms with episodes. Patient is very HOH and stated he could not understand me well. He handed the phone off to a family member, she reports that he has had a lot of dizziness in recent weeks. PCP is aware of these episodes per patient. Dizziness occurs sometimes when patient is walking and sometimes when he is sitting down. He does not recall any worsened dizziness or other symptoms with HVR episodes on 1/8.  Patient is overdue for f/u with Dr. Johney Frame as of 07/2018. Offered appointment tomorrow at 10:30am in [place] and patient is agreeable. Advised family member that patient should not drive at this time due to his dizziness and she verbalizes understanding.

## 2018-11-04 ENCOUNTER — Ambulatory Visit: Payer: Medicare Other | Admitting: Internal Medicine

## 2018-11-04 ENCOUNTER — Encounter: Payer: Self-pay | Admitting: Internal Medicine

## 2018-11-04 VITALS — BP 129/76 | HR 70 | Ht 69.0 in | Wt 160.0 lb

## 2018-11-04 DIAGNOSIS — I10 Essential (primary) hypertension: Secondary | ICD-10-CM

## 2018-11-04 DIAGNOSIS — I4821 Permanent atrial fibrillation: Secondary | ICD-10-CM | POA: Diagnosis not present

## 2018-11-04 DIAGNOSIS — I472 Ventricular tachycardia: Secondary | ICD-10-CM | POA: Diagnosis not present

## 2018-11-04 DIAGNOSIS — R55 Syncope and collapse: Secondary | ICD-10-CM

## 2018-11-04 DIAGNOSIS — I4729 Other ventricular tachycardia: Secondary | ICD-10-CM

## 2018-11-04 DIAGNOSIS — Z950 Presence of cardiac pacemaker: Secondary | ICD-10-CM

## 2018-11-04 DIAGNOSIS — I442 Atrioventricular block, complete: Secondary | ICD-10-CM | POA: Diagnosis not present

## 2018-11-04 LAB — CUP PACEART INCLINIC DEVICE CHECK
Battery Voltage: 2.92 V
Brady Statistic RV Percent Paced: 97 %
Implantable Lead Implant Date: 20111017
Implantable Lead Location: 753860
Implantable Lead Model: 1948
Lead Channel Pacing Threshold Pulse Width: 0.4 ms
Lead Channel Setting Pacing Amplitude: 2.5 V
Lead Channel Setting Pacing Pulse Width: 0.4 ms
Lead Channel Setting Sensing Sensitivity: 3 mV
MDC IDC MSMT BATTERY REMAINING LONGEVITY: 90 mo
MDC IDC MSMT LEADCHNL RV IMPEDANCE VALUE: 650 Ohm
MDC IDC MSMT LEADCHNL RV PACING THRESHOLD AMPLITUDE: 0.5 V
MDC IDC PG IMPLANT DT: 20111017
MDC IDC SESS DTM: 20200110161430
Pulse Gen Model: 1210
Pulse Gen Serial Number: 7127228

## 2018-11-04 MED ORDER — METOPROLOL SUCCINATE ER 25 MG PO TB24: 25.0000 mg | ORAL_TABLET | Freq: Every day | ORAL | 6 refills | Status: DC

## 2018-11-04 MED ORDER — TORSEMIDE 20 MG PO TABS: 20.0000 mg | ORAL_TABLET | Freq: Every day | ORAL | Status: DC

## 2018-11-04 NOTE — Patient Instructions (Addendum)
Medication Instructions:   Stop Labetalol.  Begin Toprol XL 25mg  daily.   Decrease Torsemide to 20mg  DAILY.  Continue all other medications.    Labwork:  CBC, CMET, Mg, TSH - orders given today.   Office will contact with results via phone or letter.    Testing/Procedures: none  Follow-Up:  4 weeks - Dr. Johney Frame  Next week - Purvis Sheffield or extender   Any Other Special Instructions Will Be Listed Below (If Applicable). NO DRIVING TILL CLEARED   If you need a refill on your cardiac medications before your next appointment, please call your pharmacy.

## 2018-11-04 NOTE — Progress Notes (Signed)
PCP: Kirstie Peri, MD Primary Cardiologist: Dr Purvis Sheffield Primary EP:  Dr Johney Frame  Edwin [name] is a 85 y.o. male who presents today for routine electrophysiology followup.  He is not feeling well, worse over the past 3 days.  He has frequent dizziness.   More recently, he has become symptomatic with frequent episodes of nonsustained VT.  He reports flushing, tachypalpitations and presyncope with episodes.  Today, he denies symptoms of chest pain, shortness of breath,  lower extremity edema,  or syncope.  The patient is otherwise without complaint today.   Past Medical History:  Diagnosis Date  . Bradycardia    s/p SJM PPM by Dr Johney Frame  . CHF (congestive heart failure) (HCC)   . Hyperlipidemia   . Hypertension   . Peripheral neuropathy   . Permanent atrial fibrillation   . Subdural hematoma [facility])    Past Surgical History:  Procedure Laterality Date  . CHOLECYSTECTOMY    . CRANIOTOMY    . PACEMAKER INSERTION  10/11   [employer]    ROS- all systems are reviewed and negative except as per HPI above  Current Outpatient Medications  Medication Sig Dispense Refill  . ELIQUIS 2.5 MG TABS tablet TAKE 1 TABLET BY MOUTH TWICE DAILY 60 tablet 6  . gabapentin (NEURONTIN) 300 MG capsule Take 300 mg by mouth at bedtime.     Marland Kitchen labetalol (NORMODYNE) 200 MG tablet Take 300 mg by mouth every morning. & 200 mg by mouth at bedtime    . losartan (COZAAR) 100 MG tablet Take 100 mg by mouth daily.    . potassium chloride SA (KLOR-CON M20) 20 MEQ tablet Take 1 tablet (20 mEq total) by mouth 2 (two) times daily. (Patient taking differently: Take 20 mEq by mouth as needed. ) 180 tablet 1  . pyridOXINE (VITAMIN B-6) 100 MG tablet Take 100 mg by mouth daily.    . Tamsulosin HCl (FLOMAX) 0.4 MG CAPS Take 0.4 mg by mouth daily.     Marland Kitchen thiamine (VITAMIN B-1) 100 MG tablet Take 100 mg by mouth daily.    Marland Kitchen torsemide (DEMADEX) 20 MG tablet Take 1 tablet (20 mg total) by mouth 2 (two) times daily. (Patient  taking differently: Take 20 mg by mouth as needed. ) 180 tablet 1  . vitamin B-12 (CYANOCOBALAMIN) 100 MCG tablet Take 50 mcg by mouth daily.     No current facility-administered medications for this visit.     Physical Exam: Vitals:   11/04/18 1050  BP: 129/76  Pulse: 70  Weight: 160 lb (72.6 kg)  Height: 5\' 9"  (1.753 m)    GEN- The patient is elderly appearing, alert and oriented x 3 today.   Head- normocephalic, atraumatic Eyes-  Sclera clear, conjunctiva pink Ears- hearing intact Oropharynx- clear with dry MM Lungs- Clear to ausculation bilaterally, normal work of breathing Chest- pacemaker pocket is well healed Heart- Regular rate and rhythm (paced) GI- soft, NT, ND, + BS Extremities- no clubbing, cyanosis, no edema  Pacemaker interrogation- reviewed in detail today,  See PACEART report  Echo 05/19/18 reveals EF 50-55%, mild MR, severe LA enlargement   Assessment and Plan:  1. Symptomatic complete heart block Normal pacemaker function See Pace Art report No changes today  2. Permanent afib Rate controlled On eliquis  3. NSVT Previous echo reveals preserved EF No ischemic symptoms We discussed at length today Not an ICD candidate given advanced age and preserved EF Check labs (Cmet, CBC mg, tsh) Switch labetolol  to toprol and titrate as BP allows  4. HTN Recently has been documented to have low BPs at home Reduce torsemide to 20mg  daily Check blood work Stop labetolol Add toprol and titrate as able  Merlin  Return in 1 week for cardiology follow-up Return to see me in a month I have informed and and his family that he should not drive until he returns and is cleared by me  The patient is ill.  He is at risk for decompensation/ hospitalization.  A high level of decision making was required for this encounter.  Hillis Range MD, [facility] 11/04/2018 11:09 AM

## 2018-11-11 ENCOUNTER — Ambulatory Visit: Payer: Medicare Other | Admitting: Cardiovascular Disease

## 2018-11-11 ENCOUNTER — Encounter

## 2018-11-11 ENCOUNTER — Encounter: Payer: Self-pay | Admitting: Cardiovascular Disease

## 2018-11-11 VITALS — BP 118/64 | HR 66 | Ht 69.0 in | Wt 177.0 lb

## 2018-11-11 DIAGNOSIS — R42 Dizziness and giddiness: Secondary | ICD-10-CM

## 2018-11-11 DIAGNOSIS — I959 Hypotension, unspecified: Secondary | ICD-10-CM

## 2018-11-11 DIAGNOSIS — I442 Atrioventricular block, complete: Secondary | ICD-10-CM | POA: Diagnosis not present

## 2018-11-11 DIAGNOSIS — R600 Localized edema: Secondary | ICD-10-CM

## 2018-11-11 DIAGNOSIS — I4821 Permanent atrial fibrillation: Secondary | ICD-10-CM | POA: Diagnosis not present

## 2018-11-11 DIAGNOSIS — Z950 Presence of cardiac pacemaker: Secondary | ICD-10-CM | POA: Diagnosis not present

## 2018-11-11 DIAGNOSIS — I4729 Other ventricular tachycardia: Secondary | ICD-10-CM

## 2018-11-11 DIAGNOSIS — I472 Ventricular tachycardia: Secondary | ICD-10-CM

## 2018-11-11 MED ORDER — LOSARTAN POTASSIUM 50 MG PO TABS: 50.0000 mg | ORAL_TABLET | Freq: Every day | ORAL | 6 refills | Status: DC

## 2018-11-11 MED ORDER — POTASSIUM CHLORIDE CRYS ER 20 MEQ PO TBCR: 20.0000 meq | EXTENDED_RELEASE_TABLET | ORAL | Status: DC | PRN

## 2018-11-11 MED ORDER — TORSEMIDE 20 MG PO TABS: 20.0000 mg | ORAL_TABLET | ORAL | Status: DC | PRN

## 2018-11-11 NOTE — Progress Notes (Signed)
SUBJECTIVE: The patient presents for routine follow-up.  He recently saw Dr. Rayann Heman on 11/04/2018.  He had been experiencing symptomatic nonsustained ventricular tachycardia.  Labetalol was switched to Toprol XL.  He had been noted to have low blood pressures at home and torsemide was reduced to 20 mg as needed.  CBC showed normal white count and hemoglobin on 11/04/2018.  Alk phos mildly elevated at 102, sodium mildly low at 131, normal potassium, normal renal function, and normal TSH.  He has a history of permanent atrial fibrillation for which he takes Eliquis. He has a pacemaker for a history of symptomatic bradycardia/tachy-brady syndrome and follows with Dr. Rayann Heman.   Echocardiogram 05/19/2018: Normal left ventricular systolic function, LVEF 50 to 55%, normal regional wall motion, mild mitral regurgitation, and severe left atrial dilatation.  PASP 41 mmHg.  He is here with his daughter.  She told me that this is 1 of his good days.  He is alert and oriented.  His primary complaint relates to dizziness.  Diastolic blood pressures are sometimes in the 40 range but usually in the 70 range.  He denies chest pain and shortness of breath.  He has had some mild leg swelling but today is the first day this is happened in the past week.  His appetite is good.  He does not drink much water, maybe 12 to 16 ounces at most.  He denies syncope.  ECG performed today which I personally reviewed demonstrates a ventricular paced rhythm.  Review of Systems: As per "subjective", otherwise negative.  No Known Allergies  Current Outpatient Medications  Medication Sig Dispense Refill  . ELIQUIS 2.5 MG TABS tablet TAKE 1 TABLET BY MOUTH TWICE DAILY 60 tablet 6  . gabapentin (NEURONTIN) 300 MG capsule Take 300 mg by mouth at bedtime.     Marland Kitchen losartan (COZAAR) 100 MG tablet Take 100 mg by mouth daily.    . metoprolol succinate (TOPROL XL) 25 MG 24 hr tablet Take 1 tablet (25 mg total) by mouth daily. 30  tablet 6  . pyridOXINE (VITAMIN B-6) 100 MG tablet Take 100 mg by mouth daily.    . Tamsulosin HCl (FLOMAX) 0.4 MG CAPS Take 0.4 mg by mouth daily.     Marland Kitchen thiamine (VITAMIN B-1) 100 MG tablet Take 100 mg by mouth daily.    Marland Kitchen torsemide (DEMADEX) 20 MG tablet Take 1 tablet (20 mg total) by mouth daily.    . vitamin B-12 (CYANOCOBALAMIN) 100 MCG tablet Take 50 mcg by mouth daily.     No current facility-administered medications for this visit.     Past Medical History:  Diagnosis Date  . Bradycardia    s/p SJM PPM by Dr Rayann Heman  . CHF (congestive heart failure) ([place])   . Hyperlipidemia   . Hypertension   . Peripheral neuropathy   . Permanent atrial fibrillation   . Subdural hematoma [facility])     Past Surgical History:  Procedure Laterality Date  . CHOLECYSTECTOMY    . CRANIOTOMY    . PACEMAKER INSERTION  10/11   [employer]    Social History   Socioeconomic History  . Marital status: Widowed    Spouse name: Not on file  . Number of children: 1  . Years of education: Not on file  . Highest education level: Not on file  Occupational History  . Occupation: retired  Scientific laboratory technician  . Financial resource strain: Not on file  . Food insecurity:  Worry: Not on file    Inability: Not on file  . Transportation needs:    Medical: Not on file    Non-medical: Not on file  Tobacco Use  . Smoking status: Former Smoker    Packs/day: 0.25    Years: 45.00    Pack years: 11.25    Types: Cigarettes    Start date: 01/08/1944    Last attempt to quit: 07/26/1989    Years since quitting: 29.3  . Smokeless tobacco: Never Used  . Tobacco comment: 07/26/14 Ocasionally (socially) smoked over 25years ago- AJ  Substance and Sexual Activity  . Alcohol use: Yes    Alcohol/week: 0.0 standard drinks    Comment: 07/26/14- Drinks wine once a day everyday- AJ  . Drug use: No  . Sexual activity: Not on file  Lifestyle  . Physical activity:    Days per week: Not on file    Minutes per session: Not on  file  . Stress: Not on file  Relationships  . Social connections:    Talks on phone: Not on file    Gets together: Not on file    Attends religious service: Not on file    Active member of club or organization: Not on file    Attends meetings of clubs or organizations: Not on file    Relationship status: Not on file  . Intimate partner violence:    Fear of current or ex partner: Not on file    Emotionally abused: Not on file    Physically abused: Not on file    Forced sexual activity: Not on file  Other Topics Concern  . 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     Vitals:   11/11/18 1009  BP: 118/64  Pulse: 66  Weight: 177 lb (80.3 kg)  Height: _0  (1.753 m)    Wt Readings from Last 3 Encounters:  11/11/18 177 lb (80.3 kg)  11/04/18 160 lb (72.6 kg)  04/01/18 190 lb (86.2 kg)     PHYSICAL EXAM General: NAD HEENT: Normal. Neck: No JVD, no thyromegaly. Lungs: Clear to auscultation bilaterally with normal respiratory effort. CV: Regular rate and rhythm with premature contractions, normal S1/S2, no S3/S4, no murmur.  1+ pitting bilateral lower extremity edema, right greater than left.  Abdomen: Soft, nontender, no distention.  Neurologic: Alert and oriented.  Psych: Normal affect. Skin: Normal. Musculoskeletal: No gross deformities.    ECG: Reviewed above under Subjective   Labs: Lab Results  Component Value Date/Time   K 3.6 08/12/2010 03:33 AM   BUN 10 08/12/2010 03:33 AM   CREATININE 0.91 08/12/2010 03:33 AM   HGB 12.2 (L) 08/11/2010 10:55 AM     Lipids: No results found for: LDLCALC, LDLDIRECT, CHOL, TRIG, HDL     ASSESSMENT AND PLAN: 1.  Bilateral leg edema: Weight is up 17 pounds in 1 week since torsemide reduced to 20 mg as needed.  However, he does not appear to be in decompensated heart failure.  There is some leg swelling.  LVEF 50 to 55%.  I encouraged compression  stocking usage.  He wears them intermittently.  2.  Permanent atrial fibrillation: Heart rate is controlled on Toprol-XL.  Continue Eliquis 2.5 mg twice daily.  3.  NSVT: Stable on Toprol-XL.  No changes.  4.  Pacemaker: Normal device function.  Continue Toprol-XL.  Follows with EP.  5.  Dizziness: Given lower diastolic  blood pressure readings at home, I will reduce losartan to 50 mg daily.  I also encouraged increased water intake and compression stocking use.  All of these measures should improve dizziness and blood pressure.   Disposition: Follow up with Dr. Rayann Heman as scheduled.  Follow-up with me in May.   Kate Sable, M.D., F.A.C.C.

## 2018-11-11 NOTE — Patient Instructions (Signed)
Medication Instructions:   Continue the Torsemide & Potassium as needed (corrected on med list today).  Decrease Losartan to 50mg  daily.   Continue all other medications.    Labwork: none  Testing/Procedures: none  Follow-Up: 4 months - May   Any Other Special Instructions Will Be Listed Below (If Applicable).  If you need a refill on your cardiac medications before your next appointment, please call your pharmacy.

## 2018-11-25 ENCOUNTER — Encounter: Payer: Self-pay | Admitting: Internal Medicine

## 2018-11-25 ENCOUNTER — Ambulatory Visit: Payer: Medicare Other | Admitting: Internal Medicine

## 2018-11-25 ENCOUNTER — Encounter: Payer: Medicare Other | Admitting: Internal Medicine

## 2018-11-25 ENCOUNTER — Ambulatory Visit: Payer: Medicare Other | Admitting: Student

## 2018-11-25 VITALS — BP 140/78 | HR 65 | Ht 69.0 in | Wt 169.0 lb

## 2018-11-25 DIAGNOSIS — M791 Myalgia, unspecified site: Secondary | ICD-10-CM | POA: Diagnosis not present

## 2018-11-25 DIAGNOSIS — Z950 Presence of cardiac pacemaker: Secondary | ICD-10-CM | POA: Diagnosis not present

## 2018-11-25 DIAGNOSIS — I442 Atrioventricular block, complete: Secondary | ICD-10-CM | POA: Diagnosis not present

## 2018-11-25 DIAGNOSIS — I4821 Permanent atrial fibrillation: Secondary | ICD-10-CM | POA: Diagnosis not present

## 2018-11-25 DIAGNOSIS — I10 Essential (primary) hypertension: Secondary | ICD-10-CM

## 2018-11-25 NOTE — Patient Instructions (Signed)
Medication Instructions:  Continue all current medications.  Labwork:  CK, BMET, MG - orders given today.   Office will contact with results via phone or letter.    Testing/Procedures: none  Follow-Up: 6 months  Any Other Special Instructions Will Be Listed Below (If Applicable).  If you need a refill on your cardiac medications before your next appointment, please call your pharmacy.

## 2018-11-25 NOTE — Progress Notes (Addendum)
    PCP: Kirstie Peri, MD Primary Cardiologist: Dr Purvis Sheffield Primary EP:  Dr Johney Frame  Edwin [name] is a 85 y.o. male who presents today for routine electrophysiology followup.  Since last being seen in our clinic, the patient reports doing better.  Palpitations, dizziness and fluttering are better.  NSVT has also improved. Edema is stable.  Weight is stable.  Today, he denies symptoms of  chest pain, shortness of breath,  presyncope, or syncope.  The patient is otherwise without complaint today.   Past Medical History:  Diagnosis Date  . Bradycardia    s/p SJM PPM by Dr Johney Frame  . CHF (congestive heart failure) (HCC)   . Hyperlipidemia   . Hypertension   . Peripheral neuropathy   . Permanent atrial fibrillation   . Subdural hematoma [facility])    Past Surgical History:  Procedure Laterality Date  . CHOLECYSTECTOMY    . CRANIOTOMY    . PACEMAKER INSERTION  10/11   [employer]    ROS- all systems are reviewed and negative except as per HPI above  Current Outpatient Medications  Medication Sig Dispense Refill  . ELIQUIS 2.5 MG TABS tablet TAKE 1 TABLET BY MOUTH TWICE DAILY 60 tablet 6  . gabapentin (NEURONTIN) 300 MG capsule Take 300 mg by mouth at bedtime.     Marland Kitchen losartan (COZAAR) 50 MG tablet Take 1 tablet (50 mg total) by mouth daily. 30 tablet 6  . metoprolol succinate (TOPROL XL) 25 MG 24 hr tablet Take 1 tablet (25 mg total) by mouth daily. 30 tablet 6  . potassium chloride SA (K-DUR,KLOR-CON) 20 MEQ tablet Take 1 tablet (20 mEq total) by mouth as needed.    . Tamsulosin HCl (FLOMAX) 0.4 MG CAPS Take 0.4 mg by mouth daily.     Marland Kitchen torsemide (DEMADEX) 20 MG tablet Take 1 tablet (20 mg total) by mouth as needed.     No current facility-administered medications for this visit.     Physical Exam: Vitals:   11/25/18 1042  BP: 140/78  Pulse: 65  SpO2: 97%  Weight: 169 lb (76.7 kg)  Height: 5\' 9"  (1.753 m)    GEN- The patient is better appearing than last visit, alert and  oriented x 3 today.   Head- normocephalic, atraumatic Eyes-  Sclera clear, conjunctiva pink Ears- hearing intact Oropharynx- clear Lungs- Clear to ausculation bilaterally, normal work of breathing Chest- pacemaker pocket is well healed Heart- Regular rate and rhythm, no murmurs, rubs or gallops, PMI not laterally displaced GI- soft, NT, ND, + BS Extremities- no clubbing, cyanosis, or edema   Assessment and Plan:  1. Symptomatic complete heart block Device not checked today  2. NSVT Improved Not an ICD candidate given advanced age and preserved EF  3. HTN Stable I recently reduced torsemide due to low BPs .  Dr Purvis Sheffield also reduced lostartan to 50mg  daily.  He appears to be doing better with reduced symptomatic hypotension. No change required today  4. Permanent afib Rate controlled  On eliquis  5. Bilateral leg edema/ venous insufficiency Support hose advised by Dr Purvis Sheffield  Weight is improved and probably near baseline Wt Readings from Last 3 Encounters:  11/25/18 169 lb (76.7 kg)  11/11/18 177 lb (80.3 kg)  11/04/18 160 lb (72.6 kg)   6. Dizziness Much improved  Merlin Follows up with Dr Purvis Sheffield in May I will see again in 6 months   Hillis Range MD, [facility] 11/25/2018 11:12 AM

## 2018-12-07 ENCOUNTER — Telehealth: Payer: Self-pay | Admitting: *Deleted

## 2018-12-07 NOTE — Telephone Encounter (Signed)
Notes recorded by Lesle Chris, LPN on 2/62/0355 at 6:27 PM EST Son Nilo Kalscheur) notified. Copy to pmd. Follow up scheduled for May with Dr. Purvis Sheffield. ------  Notes recorded by Hillis Range, MD on 12/03/2018 at 8:44 PM EST Results reviewed. , please inform pt of result. I will route to primary care also.

## 2019-01-30 ENCOUNTER — Ambulatory Visit (INDEPENDENT_AMBULATORY_CARE_PROVIDER_SITE_OTHER): Payer: Medicare Other | Admitting: *Deleted

## 2019-01-30 ENCOUNTER — Other Ambulatory Visit: Payer: Self-pay

## 2019-01-30 DIAGNOSIS — I442 Atrioventricular block, complete: Secondary | ICD-10-CM

## 2019-01-30 DIAGNOSIS — I4821 Permanent atrial fibrillation: Secondary | ICD-10-CM

## 2019-01-30 LAB — CUP PACEART REMOTE DEVICE CHECK
Battery Remaining Longevity: 95 mo
Battery Remaining Percentage: 73 %
Battery Voltage: 2.92 V
Brady Statistic RV Percent Paced: 94 %
Date Time Interrogation Session: 20200406062355
Implantable Lead Implant Date: 20111017
Implantable Lead Location: 753860
Implantable Lead Model: 1948
Implantable Pulse Generator Implant Date: 20111017
Lead Channel Impedance Value: 560 Ohm
Lead Channel Pacing Threshold Amplitude: 0.5 V
Lead Channel Pacing Threshold Pulse Width: 0.4 ms
Lead Channel Sensing Intrinsic Amplitude: 12 mV
Lead Channel Setting Pacing Amplitude: 2.5 V
Lead Channel Setting Pacing Pulse Width: 0.4 ms
Lead Channel Setting Sensing Sensitivity: 3 mV
Pulse Gen Model: 1210
Pulse Gen Serial Number: 7127228

## 2019-02-07 NOTE — Progress Notes (Signed)
Remote pacemaker transmission.   

## 2019-03-21 ENCOUNTER — Other Ambulatory Visit: Payer: Self-pay

## 2019-03-21 ENCOUNTER — Encounter: Payer: Self-pay | Admitting: Cardiovascular Disease

## 2019-03-21 ENCOUNTER — Telehealth (INDEPENDENT_AMBULATORY_CARE_PROVIDER_SITE_OTHER): Payer: Medicare Other | Admitting: Cardiovascular Disease

## 2019-03-21 VITALS — BP 157/80 | HR 66 | Ht 69.0 in | Wt 187.0 lb

## 2019-03-21 DIAGNOSIS — R29898 Other symptoms and signs involving the musculoskeletal system: Secondary | ICD-10-CM | POA: Diagnosis not present

## 2019-03-21 DIAGNOSIS — Z950 Presence of cardiac pacemaker: Secondary | ICD-10-CM

## 2019-03-21 DIAGNOSIS — R0609 Other forms of dyspnea: Secondary | ICD-10-CM

## 2019-03-21 DIAGNOSIS — R600 Localized edema: Secondary | ICD-10-CM

## 2019-03-21 DIAGNOSIS — I4729 Other ventricular tachycardia: Secondary | ICD-10-CM

## 2019-03-21 DIAGNOSIS — I472 Ventricular tachycardia: Secondary | ICD-10-CM

## 2019-03-21 DIAGNOSIS — I442 Atrioventricular block, complete: Secondary | ICD-10-CM

## 2019-03-21 DIAGNOSIS — I10 Essential (primary) hypertension: Secondary | ICD-10-CM

## 2019-03-21 DIAGNOSIS — I4821 Permanent atrial fibrillation: Secondary | ICD-10-CM

## 2019-03-21 NOTE — Progress Notes (Signed)
Virtual Visit via Telephone Note   This visit type was conducted due to national recommendations for restrictions regarding the COVID-19 Pandemic (e.g. social distancing) in an effort to limit this patient's exposure and mitigate transmission in our community.  Due to his co-morbid illnesses, this patient is at least at moderate risk for complications without adequate follow up.  This format is felt to be most appropriate for this patient at this time.  The patient did not have access to video technology/had technical difficulties with video requiring transitioning to audio format only (telephone).  All issues noted in this document were discussed and addressed.  No physical exam could be performed with this format.  Please refer to the patient's chart for his  consent to telehealth for [facility].   Date:  03/21/2019   ID:  Edwin [name], DOB 10-Aug-1921, MRN [id number]  Patient Location: Home Provider Location: Office  PCP:  Kirstie Peri, MD  Cardiologist:  Prentice Docker, MD  Electrophysiologist:  Hillis Range, MD   Evaluation Performed:  Follow-Up Visit  Chief Complaint:  Bilateral leg edema  History of Present Illness:    Edwin [name] is a 85 y.o. male with bilateral leg edema.  I initially spoke with the patient and then I spoke with Eber Jones, his sitter.  He complains of bilateral leg weakness which is gradually getting worse.  He was supposed to see his PCP and get some blood work this month but he canceled the appointment.  His sitter said "he is going downhill ".  He has to walk 25 steps to the bathroom but gets short of breath quite quickly.    Blood pressures have been running in the 150-160/80-90 range.  It does drop to 114/58 with exertion at times.  He has had one episode of chest pain which resolved on its own.  He did not feel well with Toprol-XL and his PCP prescribed labetalol which he takes every morning.  He had significant leg swelling last week but  this is gradually improving after his PCP started prednisone.  She said she has to fight him to take Lasix.  She keeps his legs elevated in bed.  He is also had some involuntary jerking.  He does eat his meals regularly.  The patient does not have symptoms concerning for COVID-19 infection (fever, chills, cough, or new shortness of breath).    Past Medical History:  Diagnosis Date  . Bradycardia    s/p SJM PPM by Dr Johney Frame  . CHF (congestive heart failure) (HCC)   . Hyperlipidemia   . Hypertension   . Peripheral neuropathy   . Permanent atrial fibrillation   . Subdural hematoma [facility])    Past Surgical History:  Procedure Laterality Date  . CHOLECYSTECTOMY    . CRANIOTOMY    . PACEMAKER INSERTION  10/11   [employer]     Current Meds  Medication Sig  . ELIQUIS 2.5 MG TABS tablet TAKE 1 TABLET BY MOUTH TWICE DAILY  . fexofenadine (ALLEGRA) 180 MG tablet Take 180 mg by mouth daily.  . furosemide (LASIX) 40 MG tablet Take 40 mg by mouth daily.  Marland Kitchen gabapentin (NEURONTIN) 300 MG capsule Take 900 mg by mouth at bedtime.   . hydrOXYzine (ATARAX/VISTARIL) 25 MG tablet Take 25 mg by mouth at bedtime.  Marland Kitchen labetalol (NORMODYNE) 200 MG tablet Take 200 mg by mouth every morning.  Marland Kitchen losartan (COZAAR) 50 MG tablet Take 1 tablet (50 mg total) by mouth daily.  Marland Kitchen  potassium chloride SA (K-DUR,KLOR-CON) 20 MEQ tablet Take 1 tablet (20 mEq total) by mouth as needed. (Patient taking differently: Take 20 mEq by mouth as needed. Taking 1/2 tab most days)     Allergies:   Patient has no known allergies.   Social History   Tobacco Use  . Smoking status: Former Smoker    Packs/day: 0.25    Years: 45.00    Pack years: 11.25    Types: Cigarettes    Start date: 01/08/1944    Last attempt to quit: 07/26/1989    Years since quitting: 29.6  . Smokeless tobacco: Never Used  . Tobacco comment: 07/26/14 Ocasionally (socially) smoked over 25years ago- AJ  Substance Use Topics  . Alcohol use: Yes     Alcohol/week: 0.0 standard drinks    Comment: 07/26/14- Drinks wine once a day everyday- AJ  . Drug use: No     Family Hx: The patient's family history includes Heart failure in his mother; Other (age of onset: 97) in his father.  ROS:   Please see the history of present illness.     All other systems reviewed and are negative.   Prior CV studies:   The following studies were reviewed today:    Labs/Other Tests and Data Reviewed:    EKG:  No ECG reviewed.  Recent Labs: No results found for requested labs within last 8760 hours.   Recent Lipid Panel No results found for: CHOL, TRIG, HDL, CHOLHDL, LDLCALC, LDLDIRECT  Wt Readings from Last 3 Encounters:  03/21/19 187 lb (84.8 kg)  11/25/18 169 lb (76.7 kg)  11/11/18 177 lb (80.3 kg)     Objective:    Vital Signs:  BP (!) 157/80   Pulse 66   Ht  (1.753 m)   Wt 187 lb (84.8 kg)   BMI 27.62 kg/m    VITAL SIGNS:  reviewed  ASSESSMENT & PLAN:    1.  Bilateral leg edema: He takes Lasix 40 mg daily (no longer on prn torsemide). LVEF 50 to 55%.  I encouraged compression stocking usage.  He wears them intermittently.  2.  Permanent atrial fibrillation: Heart rate is controlled on Toprol-XL.  Continue Eliquis 2.5 mg twice daily.  3.  NSVT: Stable on Toprol-XL.  No changes.  4.  Pacemaker: Continue Toprol-XL.  Follows with EP.  5.  Dizziness: Improved after I reduced losartan to 50 mg.  6. Dyspnea on exertion and bilateral leg weakness: Unclear etiology.  I spoke with his sitter and asked her to encourage him to see his PCP and get additional blood work.  We talked about the possibility of a stress test to assess for ischemic heart disease and I asked her to discuss this with him.  I told her not to push him regarding this.  If he did not want a cardiac catheterization if a stress test were found to be abnormal, there would be no point in pursuing this.  Given his advanced age, I think this is quite reasonable.    COVID-19 Education: The signs and symptoms of COVID-19 were discussed with the patient and how to seek care for testing (follow up with PCP or arrange E-visit).  The importance of social distancing was discussed today.  Time:   Today, I have spent 25 minutes with the patient with telehealth technology discussing the above problems.     Medication Adjustments/Labs and Tests Ordered: Current medicines are reviewed at length with the patient today.  Concerns regarding medicines are  outlined above.   Tests Ordered: No orders of the defined types were placed in this encounter.   Medication Changes: No orders of the defined types were placed in this encounter.   Disposition:  Follow up 6 months  Signed, Prentice DockerSuresh Gwyn Hieronymus, MD  03/21/2019 10:11 AM    [facility]

## 2019-03-21 NOTE — Patient Instructions (Signed)
Medication Instructions:  Continue all current medications.  Labwork: none  Testing/Procedures: Call office if change mind about doing stress test.    Follow-Up: Your physician wants you to follow up in: 6 months.  You will receive a reminder letter in the mail one-two months in advance.  If you don't receive a letter, please call our office to schedule the follow up appointment   Any Other Special Instructions Will Be Listed Below (If Applicable).  If you need a refill on your cardiac medications before your next appointment, please call your pharmacy.

## 2019-04-18 ENCOUNTER — Other Ambulatory Visit: Payer: Self-pay | Admitting: Cardiovascular Disease

## 2019-05-01 ENCOUNTER — Emergency Department (HOSPITAL_COMMUNITY): Payer: Medicare Other

## 2019-05-01 ENCOUNTER — Other Ambulatory Visit: Payer: Self-pay

## 2019-05-01 ENCOUNTER — Ambulatory Visit (INDEPENDENT_AMBULATORY_CARE_PROVIDER_SITE_OTHER): Payer: Medicare Other | Admitting: *Deleted

## 2019-05-01 ENCOUNTER — Inpatient Hospital Stay (HOSPITAL_COMMUNITY)
Admission: EM | Admit: 2019-05-01 | Discharge: 2019-05-06 | DRG: 871 | Disposition: A | Discharge status: Home Health | Payer: Medicare Other | Attending: Internal Medicine | Admitting: Internal Medicine

## 2019-05-01 ENCOUNTER — Encounter (HOSPITAL_COMMUNITY): Payer: Self-pay | Admitting: Emergency Medicine

## 2019-05-01 DIAGNOSIS — R6520 Severe sepsis without septic shock: Secondary | ICD-10-CM | POA: Diagnosis present

## 2019-05-01 DIAGNOSIS — W1830XA Fall on same level, unspecified, initial encounter: Secondary | ICD-10-CM | POA: Diagnosis present

## 2019-05-01 DIAGNOSIS — Z7901 Long term (current) use of anticoagulants: Secondary | ICD-10-CM

## 2019-05-01 DIAGNOSIS — R4182 Altered mental status, unspecified: Secondary | ICD-10-CM

## 2019-05-01 DIAGNOSIS — I739 Peripheral vascular disease, unspecified: Secondary | ICD-10-CM | POA: Diagnosis present

## 2019-05-01 DIAGNOSIS — N179 Acute kidney failure, unspecified: Secondary | ICD-10-CM | POA: Diagnosis present

## 2019-05-01 DIAGNOSIS — N3001 Acute cystitis with hematuria: Secondary | ICD-10-CM

## 2019-05-01 DIAGNOSIS — Z781 Physical restraint status: Secondary | ICD-10-CM

## 2019-05-01 DIAGNOSIS — S51812A Laceration without foreign body of left forearm, initial encounter: Secondary | ICD-10-CM | POA: Diagnosis present

## 2019-05-01 DIAGNOSIS — Z950 Presence of cardiac pacemaker: Secondary | ICD-10-CM | POA: Diagnosis not present

## 2019-05-01 DIAGNOSIS — Z66 Do not resuscitate: Secondary | ICD-10-CM | POA: Diagnosis present

## 2019-05-01 DIAGNOSIS — I442 Atrioventricular block, complete: Secondary | ICD-10-CM

## 2019-05-01 DIAGNOSIS — I10 Essential (primary) hypertension: Secondary | ICD-10-CM | POA: Diagnosis present

## 2019-05-01 DIAGNOSIS — G9341 Metabolic encephalopathy: Secondary | ICD-10-CM | POA: Diagnosis present

## 2019-05-01 DIAGNOSIS — I110 Hypertensive heart disease with heart failure: Secondary | ICD-10-CM | POA: Diagnosis present

## 2019-05-01 DIAGNOSIS — Y92009 Unspecified place in unspecified non-institutional (private) residence as the place of occurrence of the external cause: Secondary | ICD-10-CM | POA: Diagnosis not present

## 2019-05-01 DIAGNOSIS — N390 Urinary tract infection, site not specified: Secondary | ICD-10-CM | POA: Diagnosis present

## 2019-05-01 DIAGNOSIS — A4151 Sepsis due to Escherichia coli [E. coli]: Secondary | ICD-10-CM | POA: Diagnosis not present

## 2019-05-01 DIAGNOSIS — Z87891 Personal history of nicotine dependence: Secondary | ICD-10-CM | POA: Diagnosis not present

## 2019-05-01 DIAGNOSIS — E785 Hyperlipidemia, unspecified: Secondary | ICD-10-CM | POA: Diagnosis present

## 2019-05-01 DIAGNOSIS — I4821 Permanent atrial fibrillation: Secondary | ICD-10-CM | POA: Diagnosis present

## 2019-05-01 DIAGNOSIS — I509 Heart failure, unspecified: Secondary | ICD-10-CM | POA: Diagnosis present

## 2019-05-01 DIAGNOSIS — Z8249 Family history of ischemic heart disease and other diseases of the circulatory system: Secondary | ICD-10-CM | POA: Diagnosis not present

## 2019-05-01 DIAGNOSIS — A419 Sepsis, unspecified organism: Secondary | ICD-10-CM | POA: Diagnosis present

## 2019-05-01 DIAGNOSIS — Z1159 Encounter for screening for other viral diseases: Secondary | ICD-10-CM

## 2019-05-01 DIAGNOSIS — K4090 Unilateral inguinal hernia, without obstruction or gangrene, not specified as recurrent: Secondary | ICD-10-CM | POA: Diagnosis present

## 2019-05-01 DIAGNOSIS — I4891 Unspecified atrial fibrillation: Secondary | ICD-10-CM | POA: Diagnosis present

## 2019-05-01 DIAGNOSIS — I495 Sick sinus syndrome: Secondary | ICD-10-CM | POA: Diagnosis present

## 2019-05-01 LAB — CUP PACEART REMOTE DEVICE CHECK
Date Time Interrogation Session: 20200706132216
Implantable Lead Implant Date: 20111017
Implantable Lead Location: 753860
Implantable Lead Model: 1948
Implantable Pulse Generator Implant Date: 20111017
Pulse Gen Model: 1210
Pulse Gen Serial Number: 7127228

## 2019-05-01 LAB — COMPREHENSIVE METABOLIC PANEL
ALT: 33 U/L (ref 0–44)
AST: 43 U/L — ABNORMAL HIGH (ref 15–41)
Albumin: 3.1 g/dL — ABNORMAL LOW (ref 3.5–5.0)
Alkaline Phosphatase: 142 U/L — ABNORMAL HIGH (ref 38–126)
Anion gap: 11 (ref 5–15)
BUN: 57 mg/dL — ABNORMAL HIGH (ref 8–23)
CO2: 25 mmol/L (ref 22–32)
Calcium: 8.3 mg/dL — ABNORMAL LOW (ref 8.9–10.3)
Chloride: 94 mmol/L — ABNORMAL LOW (ref 98–111)
Creatinine, Ser: 2.18 mg/dL — ABNORMAL HIGH (ref 0.61–1.24)
GFR calc Af Amer: 29 mL/min — ABNORMAL LOW (ref >60)
GFR calc non Af Amer: 25 mL/min — ABNORMAL LOW (ref >60)
Glucose, Bld: 112 mg/dL — ABNORMAL HIGH (ref 70–99)
Potassium: 4.5 mmol/L (ref 3.5–5.1)
Sodium: 130 mmol/L — ABNORMAL LOW (ref 135–145)
Total Bilirubin: 2 mg/dL — ABNORMAL HIGH (ref 0.3–1.2)
Total Protein: 6 g/dL — ABNORMAL LOW (ref 6.5–8.1)

## 2019-05-01 LAB — LACTIC ACID, PLASMA: Lactic Acid, Venous: 1.6 mmol/L (ref 0.5–1.9)

## 2019-05-01 LAB — URINALYSIS, ROUTINE W REFLEX MICROSCOPIC
Bilirubin Urine: NEGATIVE
Glucose, UA: NEGATIVE mg/dL
Ketones, ur: NEGATIVE mg/dL
Nitrite: NEGATIVE
Protein, ur: 100 mg/dL — AB
Specific Gravity, Urine: 1.011 (ref 1.005–1.030)
WBC, UA: >50 WBC/hpf — ABNORMAL HIGH (ref 0–5)
pH: 5 (ref 5.0–8.0)

## 2019-05-01 LAB — BRAIN NATRIURETIC PEPTIDE: B Natriuretic Peptide: 655 pg/mL — ABNORMAL HIGH (ref 0.0–100.0)

## 2019-05-01 LAB — CBC WITH DIFFERENTIAL/PLATELET
Abs Immature Granulocytes: 0.1 10*3/uL — ABNORMAL HIGH (ref 0.00–0.07)
Basophils Absolute: 0 10*3/uL (ref 0.0–0.1)
Basophils Relative: 0 %
Eosinophils Absolute: 0 10*3/uL (ref 0.0–0.5)
Eosinophils Relative: 0 %
HCT: 32 % — ABNORMAL LOW (ref 39.0–52.0)
Hemoglobin: 10.5 g/dL — ABNORMAL LOW (ref 13.0–17.0)
Immature Granulocytes: 1 %
Lymphocytes Relative: 5 %
Lymphs Abs: 0.6 10*3/uL — ABNORMAL LOW (ref 0.7–4.0)
MCH: 33.2 pg (ref 26.0–34.0)
MCHC: 32.8 g/dL (ref 30.0–36.0)
MCV: 101.3 fL — ABNORMAL HIGH (ref 80.0–100.0)
Monocytes Absolute: 0.8 10*3/uL (ref 0.1–1.0)
Monocytes Relative: 7 %
Neutro Abs: 10.4 10*3/uL — ABNORMAL HIGH (ref 1.7–7.7)
Neutrophils Relative %: 87 %
Platelets: 141 10*3/uL — ABNORMAL LOW (ref 150–400)
RBC: 3.16 MIL/uL — ABNORMAL LOW (ref 4.22–5.81)
RDW: 13.7 % (ref 11.5–15.5)
WBC: 11.8 10*3/uL — ABNORMAL HIGH (ref 4.0–10.5)
nRBC: 0 % (ref 0.0–0.2)

## 2019-05-01 LAB — SARS CORONAVIRUS 2 BY RT PCR (HOSPITAL ORDER, PERFORMED IN ~~LOC~~ HOSPITAL LAB): SARS Coronavirus 2: NEGATIVE

## 2019-05-01 LAB — PROTIME-INR
INR: 2.2 — ABNORMAL HIGH (ref 0.8–1.2)
Prothrombin Time: 23.7 seconds — ABNORMAL HIGH (ref 11.4–15.2)

## 2019-05-01 LAB — APTT: aPTT: 47 seconds — ABNORMAL HIGH (ref 24–36)

## 2019-05-01 MED ORDER — SODIUM CHLORIDE 0.9 % IV SOLN
1.0000 g | Freq: Once | INTRAVENOUS | Status: AC
  Administered 2019-05-01: 19:00:00 1 g via INTRAVENOUS
  Filled 2019-05-01: qty 10

## 2019-05-01 MED ORDER — SODIUM CHLORIDE 0.9 % IV BOLUS: 1000.0000 mL | Freq: Once | INTRAVENOUS | Status: DC

## 2019-05-01 MED ORDER — SODIUM CHLORIDE 0.9 % IV BOLUS
500.0000 mL | Freq: Once | INTRAVENOUS | Status: AC
  Administered 2019-05-01: 500 mL via INTRAVENOUS

## 2019-05-01 MED ORDER — SODIUM CHLORIDE 0.9 % IV BOLUS
500.0000 mL | Freq: Once | INTRAVENOUS | Status: AC
  Administered 2019-05-01: 21:00:00 500 mL via INTRAVENOUS

## 2019-05-01 MED ORDER — ENOXAPARIN SODIUM 30 MG/0.3ML ~~LOC~~ SOLN: 30.0000 mg | SUBCUTANEOUS | Status: DC

## 2019-05-01 MED ORDER — SODIUM CHLORIDE 0.9 % IV SOLN
1.0000 g | INTRAVENOUS | Status: DC
  Administered 2019-05-02 – 2019-05-05 (×4): 1 g via INTRAVENOUS
  Filled 2019-05-01 (×4): qty 10

## 2019-05-01 NOTE — ED Triage Notes (Signed)
Weakness and altered mental status x2 days.  Dx with uti x 2 weeks ago.

## 2019-05-01 NOTE — ED Triage Notes (Signed)
EMS reports family states pt has been altered for 2 days. Pt answers questions and points to right groin when asked if he is in pain. Bladder scan shows 56ml. Large inguinal hernia noted.

## 2019-05-01 NOTE — ED Notes (Signed)
Pt tense and having jerking movements, unable to get good bp reading.

## 2019-05-01 NOTE — H&P (Signed)
History and Physical    Edwin IlesMitchell B Rothbauer XLK:[id number] DOB: Aug 17, 1921 DOA: 05/01/2019  PCP: Kirstie PeriShah, Ashish, MD   Patient coming from: Home.  I have personally briefly reviewed patient's old medical records [facility]  Chief Complaint: AMS.  HPI: Edwin [name] is a 85 y.o. male with medical history significant of bradycardia, status post SJM/PPM by Dr. Johney FrameAllred, permanent atrial fibrillation, history of subdural hematoma, unspecified CHF, hyperlipidemia, hypertension, peripheral neuropathy who has been treated recently for UTI with oral antibiotics and is coming to the emergency department due to AMS.  He is unable to provide further information.  I spoke to the patient's son, who stated that he did not have further information as well.  He mentions speaking to the patient's primary caregiver, but was unable to obtain sufficient information.  ED Course: Initial vital signs temperature 98.5 F, pulse 66, respirations 22, blood pressure 111/90 mmHg and O2 sat 97% on room air.  The patient received IV fluids and a dose of ceftriaxone in the emergency department.  Urinalysis showed large hemoglobinuria and leukocyte esterase.  More than 50 WBC is on hpf with many bacteria.  White count is 11 point a with 87% neutrophils, hemoglobin 10.5 g/dL and platelets [id number].  Sodium is 130, potassium 4.5 CO2 25, and chloride 94 mmol/L.  Glucose 112, BUN 57 and creatinine 2.18.  Almost 9 years ago his creatinine was at baseline.  Total protein 6.0 and albumin 3.1 g/dL.  AST was 43, ALT 33, alkaline phosphatase 142 and total bilirubin 2.0 mg/dL.  Imaging: Chest radiograph shows small left pleural effusion, but no focal infiltrates.  CT head did not show any acute intracranial abnormality.  CT abdomen without contrast showed large right inguinal hernia containing sigmoid colon without obstruction or inflammation.  Fat within the left inguinal canal with bowel involvement.  There is marked urinary bladder wall  thickening.  Colonic diverticulosis.  Advanced aortic atherosclerosis.  Please see images and full radiology report for further details.  Review of Systems: As per HPI otherwise 10 point review of systems negative.   Past Medical History:  Diagnosis Date   Bradycardia    s/p SJM PPM by Dr Johney FrameAllred   CHF (congestive heart failure) (HCC)    Hyperlipidemia    Hypertension    Peripheral neuropathy    Permanent atrial fibrillation    Subdural hematoma (HCC)     Past Surgical History:  Procedure Laterality Date   CHOLECYSTECTOMY     CRANIOTOMY     PACEMAKER INSERTION  10/11   [employer] Jude     reports that he quit smoking about 29 years ago. His smoking use included cigarettes. He started smoking about 75 years ago. He has a 11.25 pack-year smoking history. He has never used smokeless tobacco. He reports current alcohol use. He reports that he does not use drugs.  Allergies  Allergen Reactions   Amlodipine    Lisinopril     Family History  Problem Relation Age of Onset   Heart failure Mother    Other Father [id number]       typhoid   Prior to Admission medications   Medication Sig Start Date End Date Taking? Authorizing Provider  diphenhydrAMINE (ALLERGY MEDICATION) 25 mg capsule Take 25 mg by mouth daily. OTC allergy med   Yes [provider]  ELIQUIS 2.5 MG TABS tablet TAKE 1 TABLET BY MOUTH TWICE DAILY Patient taking differently: Take 2.5 mg by mouth 2 (two) times daily.  12/25/15  Yes Koneswaran, Suresh A, MD  furosemide (LALaqueta LindenSIX) 40 MG tablet Take 40 mg by mouth 2 (two) times daily.    Yes [provider]  gabapentin (NEURONTIN) 300 MG capsule Take 900 mg by mouth at bedtime.    Yes [provider]  hydrOXYzine (ATARAX/VISTARIL) 25 MG tablet Take 25 mg by mouth at bedtime. For itching   Yes [provider]  labetalol (NORMODYNE) 200 MG tablet Take 100-200 mg by mouth every morning.    Yes [provider]  losartan (COZAAR) 50 MG  tablet Take 1 tablet (50 mg total) by mouth daily. Patient taking differently: Take 100 mg by mouth daily.  11/11/18  Yes Laqueta LindenKoneswaran, Suresh A, MD  oxyCODONE-acetaminophen (PERCOCET/ROXICET) 5-325 MG tablet Take 0.5-1 tablets by mouth daily as needed for moderate pain or severe pain.   Yes [provider]  potassium chloride SA (K-DUR) 20 MEQ tablet Take 1 tablet (20 mEq total) by mouth daily as needed (taking 1/2 most days). Taking 1/2 tab most days Patient taking differently: Take 20 mEq by mouth daily. Taking 1/2 tab most days 04/18/19  Yes Laqueta LindenKoneswaran, Suresh A, MD  Tamsulosin HCl (FLOMAX) 0.4 MG CAPS Take 0.4 mg by mouth daily.    Yes [provider]  tobramycin (TOBREX) 0.3 % ophthalmic solution Place 2 drops into the left eye 4 (four) times daily.   Yes [provider]    Physical Exam: Vitals:   05/01/19 1900 05/01/19 2000 05/01/19 2100 05/01/19 2107  BP: 118/65 (!) 120/107  114/89  Pulse:    75  Resp: (!) 23 17  17   Temp:   98 F (36.7 C)   TempSrc:   Axillary   SpO2:  95%  92%  Weight:   87.2 kg   Height:        Constitutional: NAD, calm, comfortable Eyes: PERRL, lids and conjunctivae normal ENMT: Mucous membranes are mildly dry. Posterior pharynx clear of any exudate or lesions. Neck: normal, supple, no masses, no thyromegaly Respiratory: Decreased breath sounds in bases, otherwise clear to auscultation bilaterally, no wheezing, no crackles. Normal respiratory effort. No accessory muscle use.  Cardiovascular: Regular rate and rhythm, no murmurs / rubs / gallops. No extremity edema. 2+ pedal pulses. No carotid bruits.  Abdomen: Soft, no tenderness, no masses palpated. No hepatosplenomegaly. Bowel sounds positive.  Musculoskeletal: no clubbing / cyanosis. Good ROM, no contractures. Normal muscle tone.  Skin: Extensive left forearm anterior laceration.  Multiple areas of ecchymosis on extremities.  Please see pictures below. Neurologic: Moves all  extremities.  Grossly nonfocal.  Unable to fully evaluate. Psychiatric: Alert, oriented to name only.        Labs on Admission: I have personally reviewed following labs and imaging studies  CBC: Recent Labs  Lab 05/01/19 1700  WBC 11.8*  NEUTROABS 10.4*  HGB 10.5*  HCT 32.0*  MCV 101.3*  PLT 141*   Basic Metabolic Panel: Recent Labs  Lab 05/01/19 1700  NA 130*  K 4.5  CL 94*  CO2 25  GLUCOSE 112*  BUN 57*  CREATININE 2.18*  CALCIUM 8.3*   GFR: Estimated Creatinine Clearance: 21.8 mL/min (A) (by C-G formula based on SCr of 2.18 mg/dL (H)). Liver Function Tests: Recent Labs  Lab 05/01/19 1700  AST 43*  ALT 33  ALKPHOS 142*  BILITOT 2.0*  PROT 6.0*  ALBUMIN 3.1*   No results for input(s): LIPASE, AMYLASE in the last 168 hours. No results for input(s): AMMONIA in the last 168 hours.  Coagulation Profile: Recent Labs  Lab 05/01/19 1700  INR 2.2*   Cardiac Enzymes: No results for input(s): CKTOTAL, CKMB, CKMBINDEX, TROPONINI in the last 168 hours. BNP (last 3 results) No results for input(s): PROBNP in the last 8760 hours. HbA1C: No results for input(s): HGBA1C in the last 72 hours. CBG: No results for input(s): GLUCAP in the last 168 hours. Lipid Profile: No results for input(s): CHOL, HDL, LDLCALC, TRIG, CHOLHDL, LDLDIRECT in the last 72 hours. Thyroid Function Tests: No results for input(s): TSH, T4TOTAL, FREET4, T3FREE, THYROIDAB in the last 72 hours. Anemia Panel: No results for input(s): VITAMINB12, FOLATE, FERRITIN, TIBC, IRON, RETICCTPCT in the last 72 hours. Urine analysis:    Component Value Date/Time   COLORURINE AMBER (A) 05/01/2019 1635   APPEARANCEUR CLOUDY (A) 05/01/2019 1635   LABSPEC 1.011 05/01/2019 1635   PHURINE 5.0 05/01/2019 1635   GLUCOSEU NEGATIVE 05/01/2019 1635   HGBUR LARGE (A) 05/01/2019 1635   BILIRUBINUR NEGATIVE 05/01/2019 1635   KETONESUR NEGATIVE 05/01/2019 1635   PROTEINUR 100 (A) 05/01/2019 1635   NITRITE  NEGATIVE 05/01/2019 1635   LEUKOCYTESUR LARGE (A) 05/01/2019 1635    Radiological Exams on Admission: Ct Abdomen Pelvis Wo Contrast  Result Date: 05/01/2019 CLINICAL DATA:  85 year old with painful right inguinal hernia and abdominal distension. Altered mental status. EXAM: CT ABDOMEN AND PELVIS WITHOUT CONTRAST TECHNIQUE: Multidetector CT imaging of the abdomen and pelvis was performed following the standard protocol without IV contrast. COMPARISON:  CT 09/24/2016 FINDINGS: Lower chest: Small bilateral pleural effusions and compressive atelectasis. Pacemaker partially included. Dense coronary artery calcifications. Mild cardiomegaly. Pleural calcifications in the right hemithorax. Additional calcified granuloma. Left Bochdalek hernia contains fat. Hepatobiliary: Motion artifact. No focal abnormality. Clips in the gallbladder fossa postcholecystectomy. No biliary dilatation. Pancreas: Motion artifact. Parenchymal atrophy. No ductal dilatation or inflammation. Spleen: Normal in size without focal abnormality. Adrenals/Urinary Tract: Normal adrenal glands. Bilateral perinephric edema. No hydronephrosis. Urinary bladder is decompressed by Foley catheter, there is marked urinary bladder wall thickening. Right aspect of the bladder extends towards the inguinal hernia. Stomach/Bowel: Large right inguinal hernia not entirely included in the field of view, hernia contains sigmoid colon without evidence of obstruction or wall thickening. Descending and sigmoid colonic diverticulosis without diverticulitis. Moderate diffuse colonic stool burden. Normal appendix. There is no small bowel obstruction or inflammation. Stomach is nondistended. Vascular/Lymphatic: Advanced aortic and branch atherosclerosis. No aneurysm. No bulky abdominopelvic adenopathy. Reproductive: Prominent prostate gland spans 5.3 cm. Other: Fat within the left inguinal canal, no bowel involvement. Large right inguinal hernia contains sigmoid colon. No  ascites or free air. Musculoskeletal: Mild flattening of T10 vertebra. Bones are under mineralized. Multilevel degenerative change. There are no acute or suspicious osseous abnormalities. IMPRESSION: 1. Large right inguinal hernia containing sigmoid colon without obstruction or inflammation. Fat within the left inguinal canal without bowel involvement. 2. Marked urinary bladder wall thickening, bladder decompressed by Foley catheter. 3. Colonic diverticulosis without diverticulitis. 4. Advanced Aortic Atherosclerosis (ICD10-I70.0). 5. Small bilateral pleural effusions and adjacent compressive atelectasis. Electronically Signed   By: Keith Rake M.D.   On: 05/01/2019 20:43   Dg Chest 1 View  Result Date: 05/01/2019 CLINICAL DATA:  Altered mental status for 2 days EXAM: CHEST  1 VIEW COMPARISON:  09/24/2016 FINDINGS: Cardiac shadow is enlarged. Pacing device is noted and stable. The lungs are well aerated bilaterally. Small left pleural effusion is noted. No focal infiltrate is seen. Old rib fractures are noted. IMPRESSION: Small left pleural effusion.  No focal  infiltrate is noted. Electronically Signed   By: Alcide CleverMark  Lukens M.D.   On: 05/01/2019 18:20   Ct Head Wo Contrast  Result Date: 05/01/2019 CLINICAL DATA:  Weakness and altered mental status for 2 days. EXAM: CT HEAD WITHOUT CONTRAST TECHNIQUE: Contiguous axial images were obtained from the base of the skull through the vertex without intravenous contrast. COMPARISON:  None. FINDINGS: Brain: No evidence of acute infarction, hemorrhage, hydrocephalus, extra-axial collection or mass lesion/mass effect. Moderate brain parenchymal volume loss and deep white matter microangiopathy. Vascular: Calcific atherosclerotic disease at the skull base. Skull: Normal. Negative for fracture or focal lesion. Sinuses/Orbits: No acute finding. Other: None. IMPRESSION: 1. No acute intracranial abnormality. 2. Atrophy, chronic microvascular disease. Electronically Signed    By: Ted Mcalpineobrinka  Dimitrova M.D.   On: 05/01/2019 18:48    EKG: Independently reviewed.  Vent. rate 60 BPM PR interval * ms QRS duration 165 ms QT/QTc 447/447 ms P-R-T axes * 268 76 Afib/flut and V-paced complexes No further analysis attempted due to paced rhythm  Assessment/Plan Principal Problem:   Sepsis secondary to UTI (HCC) Admit to stepdown/inpatient. Continue ceftriaxone 1 g IVPB every 24 hours. Monitor CBC and CMP. Follow urine culture and sensitivity. May need to consult PT prior to discharge.  Active Problems:   Essential hypertension Labetalol 10 mg IVP every 2 hours PRN. Resume oral antihypertensives once MS more responsive.    ATRIAL FIBRILLATION  CHA?DS?-VASc Score of at least 5. Resume Eliquis in a.m. Labetalol has been held. Resume once more alert.    BRADYCARDIA-TACHYCARDIA SYNDROME Status post pacemaker placement. Resume oral beta-blocker once he is more alert.    Hyperlipidemia Currently not on medical therapy.    History of unspecified CHF Check echocardiogram.   DVT prophylaxis: On Eliquis. Code Status: Full code. Family Communication: I spoke to and updated his son Loraine LericheMark [phone]). Disposition Plan: Admit for IV antibiotic therapy for 2-3 days. Consults called:  Admission status: Inpatient/   Bobette Moavid Manuel Margues Filippini MD [facility]  05/01/2019, 9:58 PM

## 2019-05-01 NOTE — ED Notes (Signed)
See triage ntoes. Pt has bulging area noted to right lower quad. Diaper is wet.

## 2019-05-01 NOTE — ED Provider Notes (Signed)
[facility] EMERGENCY DEPARTMENT Provider Note   CSN: [id number] Arrival date & time: 05/01/19  1558    History   Chief Complaint Chief Complaint  Patient presents with  . Abdominal Pain    HPI Edwin [name] is a 85 y.o. male with a history of CHF, htn, peripheral neuropathy, permanent afib on eliquis with pacemaker, h/o subdural hematoma with a 2 day history of altered mental status. He was treated for a uti 2 weeks ago, then 2 days ago started to become less conversive and when asked if he hurts anywhere, covers his right groin where there is a large inguinal hernia. He lives at home with a full time caregiver who has noted he has had increased generalized weakness, less exercise tolerance and becomes increasingly short of breath simply with walks to the bathroom.  He was evaluated by his cardiologist one month ago and a stress test was discussed but it appears no decision has been made yet regarding undergoing this test.       The history is provided by the patient.    Past Medical History:  Diagnosis Date  . Bradycardia    s/p SJM PPM by Dr Johney FrameAllred  . CHF (congestive heart failure) (HCC)   . Hyperlipidemia   . Hypertension   . Peripheral neuropathy   . Permanent atrial fibrillation   . Subdural hematoma [facility](HCC)     Patient Active Problem List   Diagnosis Date Noted  . Sepsis secondary to UTI (HCC) 05/01/2019  . Leg edema, left 02/08/2018  . 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 MITRAL REGURGITATION 08/08/2010  . SUBDURAL HEMATOMA, HX OF 08/08/2010  . CARDIOVASCULAR FUNCTION STUDY, ABNORMAL 10/28/2009  . HYPERLIPIDEMIA 10/24/2009  . HYPOSMOLALITY AND/OR HYPONATREMIA 10/24/2009  . HYPOPOTASSEMIA 10/24/2009  . Essential hypertension 10/24/2009  . ATRIAL FIBRILLATION 10/24/2009  . OTHER SPECIFIED CARDIAC DYSRHYTHMIAS 10/24/2009  . SYNCOPE AND COLLAPSE 10/24/2009  . PERSONAL HX TIA & CI W/O RESIDUAL DEFICITS 10/24/2009     Past Surgical History:  Procedure Laterality Date  . CHOLECYSTECTOMY    . CRANIOTOMY    . PACEMAKER INSERTION  10/11   [employer] Jude        Home Medications    Prior to Admission medications   Medication Sig Start Date End Date Taking? Authorizing Provider  ELIQUIS 2.5 MG TABS tablet TAKE 1 TABLET BY MOUTH TWICE DAILY 12/25/15  Yes Laqueta LindenKoneswaran, Suresh A, MD  furosemide (LASIX) 40 MG tablet Take 40 mg by mouth 2 (two) times daily.    Yes [provider]  gabapentin (NEURONTIN) 300 MG capsule Take 900 mg by mouth at bedtime.    Yes [provider]  labetalol (NORMODYNE) 200 MG tablet Take 200 mg by mouth every morning.   Yes [provider]  losartan (COZAAR) 50 MG tablet Take 1 tablet (50 mg total) by mouth daily. Patient taking differently: Take 100 mg by mouth daily.  11/11/18  Yes Laqueta LindenKoneswaran, Suresh A, MD  potassium chloride SA (K-DUR) 20 MEQ tablet Take 1 tablet (20 mEq total) by mouth daily as needed (taking 1/2 most days). Taking 1/2 tab most days Patient taking differently: Take 20 mEq by mouth daily. Taking 1/2 tab most days 04/18/19  Yes Laqueta LindenKoneswaran, Suresh A, MD  Tamsulosin HCl (FLOMAX) 0.4 MG CAPS Take 0.4 mg by mouth daily.    Yes [provider]  tobramycin (TOBREX) 0.3 % ophthalmic solution Place 2 drops into the left eye 4 (four) times daily.  Yes [provider]  fexofenadine (ALLEGRA) 180 MG tablet Take 180 mg by mouth daily.    [provider]  hydrOXYzine (ATARAX/VISTARIL) 25 MG tablet Take 25 mg by mouth at bedtime.    [provider]  torsemide (DEMADEX) 20 MG tablet Take 1 tablet (20 mg total) by mouth as needed. Patient not taking: Reported on 03/21/2019 11/11/18 03/20/20  Herminio Commons, MD    Family History Family History  Problem Relation Age of Onset  . Heart failure Mother   . Other Father 34       typhoid    Social History Social History   Tobacco Use  . Smoking status: Former Smoker     Packs/day: 0.25    Years: 45.00    Pack years: 11.25    Types: Cigarettes    Start date: 01/08/1944    Quit date: 07/26/1989    Years since quitting: 29.7  . Smokeless tobacco: Never Used  . Tobacco comment: 07/26/14 Ocasionally (socially) smoked over 25years ago- AJ  Substance Use Topics  . Alcohol use: Yes    Alcohol/week: 0.0 standard drinks    Comment: 07/26/14- Drinks wine once a day everyday- AJ  . Drug use: No     Allergies   Amlodipine and Lisinopril   Review of Systems Review of Systems  Unable to perform ROS: Mental status change     Physical Exam Updated Vital Signs BP (!) 122/102   Pulse 62   Temp 98.5 F (36.9 C) (Rectal)   Resp 18   Ht 6' (1.829 m)   Wt 84.8 kg   SpO2 95%   BMI 25.35 kg/m   Physical Exam Vitals signs and nursing note reviewed.  Constitutional:      Appearance: He is well-developed. He is ill-appearing.  HENT:     Head: Normocephalic and atraumatic.  Eyes:     Conjunctiva/sclera: Conjunctivae normal.  Neck:     Musculoskeletal: Normal range of motion.  Cardiovascular:     Rate and Rhythm: Normal rate and regular rhythm.     Heart sounds: Normal heart sounds.  Pulmonary:     Effort: Pulmonary effort is normal.     Breath sounds: Normal breath sounds. No wheezing.  Abdominal:     General: Bowel sounds are normal.     Palpations: Abdomen is soft.     Tenderness: There is abdominal tenderness in the right lower quadrant.     Hernia: A hernia is present. Hernia is present in the right inguinal area.     Comments: Large inguinal hernia right, soft but painful response with palpation.   Musculoskeletal: Normal range of motion.  Skin:    General: Skin is warm and dry.  Neurological:     Mental Status: He is alert.      ED Treatments / Results  Labs (all labs ordered are listed, but only abnormal results are displayed) Labs Reviewed  CBC WITH DIFFERENTIAL/PLATELET - Abnormal; Notable for the following components:      Result  Value   WBC 11.8 (*)    RBC 3.16 (*)    Hemoglobin 10.5 (*)    HCT 32.0 (*)    MCV 101.3 (*)    Platelets 141 (*)    Neutro Abs 10.4 (*)    Lymphs Abs 0.6 (*)    Abs Immature Granulocytes 0.10 (*)    All other components within normal limits  COMPREHENSIVE METABOLIC PANEL - Abnormal; Notable for the following components:   Sodium  130 (*)    Chloride 94 (*)    Glucose, Bld 112 (*)    BUN 57 (*)    Creatinine, Ser 2.18 (*)    Calcium 8.3 (*)    Total Protein 6.0 (*)    Albumin 3.1 (*)    AST 43 (*)    Alkaline Phosphatase 142 (*)    Total Bilirubin 2.0 (*)    GFR calc non Af Amer 25 (*)    GFR calc Af Amer 29 (*)    All other components within normal limits  URINALYSIS, ROUTINE W REFLEX MICROSCOPIC - Abnormal; Notable for the following components:   Color, Urine AMBER (*)    APPearance CLOUDY (*)    Hgb urine dipstick LARGE (*)    Protein, ur 100 (*)    Leukocytes,Ua LARGE (*)    WBC, UA >50 (*)    Bacteria, UA MANY (*)    Non Squamous Epithelial 0-5 (*)    All other components within normal limits  URINE CULTURE  SARS CORONAVIRUS 2 (HOSPITAL ORDER, PERFORMED IN [facility])  LACTIC ACID, PLASMA  BRAIN NATRIURETIC PEPTIDE    EKG EKG Interpretation  Date/Time:  Monday May 01 2019 16:27:14 EDT Ventricular Rate:  60 PR Interval:    QRS Duration: 165 QT Interval:  447 QTC Calculation: 447 R Axis:   -92 Text Interpretation:  Afib/flut and V-paced complexes No further analysis attempted due to paced rhythm Confirmed by Loren RacerYelverton, David ([id number]) on 05/01/2019 4:57:04 PM   Radiology Dg Chest 1 View  Result Date: 05/01/2019 CLINICAL DATA:  Altered mental status for 2 days EXAM: CHEST  1 VIEW COMPARISON:  09/24/2016 FINDINGS: Cardiac shadow is enlarged. Pacing device is noted and stable. The lungs are well aerated bilaterally. Small left pleural effusion is noted. No focal infiltrate is seen. Old rib fractures are noted. IMPRESSION: Small left pleural effusion.   No focal infiltrate is noted. Electronically Signed   By: Alcide CleverMark  Lukens M.D.   On: 05/01/2019 18:20   Ct Head Wo Contrast  Result Date: 05/01/2019 CLINICAL DATA:  Weakness and altered mental status for 2 days. EXAM: CT HEAD WITHOUT CONTRAST TECHNIQUE: Contiguous axial images were obtained from the base of the skull through the vertex without intravenous contrast. COMPARISON:  None. FINDINGS: Brain: No evidence of acute infarction, hemorrhage, hydrocephalus, extra-axial collection or mass lesion/mass effect. Moderate brain parenchymal volume loss and deep white matter microangiopathy. Vascular: Calcific atherosclerotic disease at the skull base. Skull: Normal. Negative for fracture or focal lesion. Sinuses/Orbits: No acute finding. Other: None. IMPRESSION: 1. No acute intracranial abnormality. 2. Atrophy, chronic microvascular disease. Electronically Signed   By: Ted Mcalpineobrinka  Dimitrova M.D.   On: 05/01/2019 18:48    Procedures Procedures (including critical care time)  Medications Ordered in ED Medications  cefTRIAXone (ROCEPHIN) 1 g in sodium chloride 0.9 % 100 mL IVPB (1 g Intravenous New Bag/Given 05/01/19 1907)  sodium chloride 0.9 % bolus 500 mL (has no administration in time range)  sodium chloride 0.9 % bolus 500 mL (500 mLs Intravenous New Bag/Given 05/01/19 1906)     Initial Impression / Assessment and Plan / ED Course  I have reviewed the triage vital signs and the nursing notes.  Pertinent labs & imaging results that were available during my care of the patient were reviewed by me and considered in my medical decision making (see chart for details).       Attempt to contact son, main contact for further info regarding pts  sx - work # - had left for the day.  Did not answer home #.   General Surgeon Dr. Henreitta LeberBridges here and bedside consult requested.  She was able to reduce the hernia, appears to be a large defect.  Recommends CT imaging for further eval.   Labs and other imaging  reviewed. Pt with uti, aki, gentle IV fluids given (chf), rocephin ordered. Discussed with Dr. Robb Matarrtiz who will see and admit pt.   Final Clinical Impressions(s) / ED Diagnoses   Final diagnoses:  Acute cystitis with hematuria  AKI (acute kidney injury) (HCC)  Unilateral inguinal hernia without obstruction or gangrene, recurrence not specified    ED Discharge Orders    None       Victoriano Laindol, Bucky Grigg, PA-C 05/01/19 1910    Loren RacerYelverton, David, MD 05/01/19 2251

## 2019-05-01 NOTE — ED Notes (Signed)
Dr Constance Haw in with pt now

## 2019-05-01 NOTE — Progress Notes (Signed)
[facility]  ED concerned right sided inguinal hernia causing pain. Able to reduce large hernia with large defect. Soft. Abdomen soft. Would recommend continued workup with labs and imagining. No incarcerated hernia noted at this time.  Curlene Labrum, MD [facility] [address] [place], [place] [address] [phone] (office)

## 2019-05-02 ENCOUNTER — Encounter (HOSPITAL_COMMUNITY): Payer: Self-pay

## 2019-05-02 ENCOUNTER — Other Ambulatory Visit: Payer: Self-pay

## 2019-05-02 DIAGNOSIS — K4090 Unilateral inguinal hernia, without obstruction or gangrene, not specified as recurrent: Secondary | ICD-10-CM

## 2019-05-02 LAB — CBC WITH DIFFERENTIAL/PLATELET
Abs Immature Granulocytes: 0.17 10*3/uL — ABNORMAL HIGH (ref 0.00–0.07)
Basophils Absolute: 0 10*3/uL (ref 0.0–0.1)
Basophils Relative: 0 %
Eosinophils Absolute: 0 10*3/uL (ref 0.0–0.5)
Eosinophils Relative: 0 %
HCT: 29.8 % — ABNORMAL LOW (ref 39.0–52.0)
Hemoglobin: 9.9 g/dL — ABNORMAL LOW (ref 13.0–17.0)
Immature Granulocytes: 1 %
Lymphocytes Relative: 5 %
Lymphs Abs: 0.6 10*3/uL — ABNORMAL LOW (ref 0.7–4.0)
MCH: 33.3 pg (ref 26.0–34.0)
MCHC: 33.2 g/dL (ref 30.0–36.0)
MCV: 100.3 fL — ABNORMAL HIGH (ref 80.0–100.0)
Monocytes Absolute: 1.2 10*3/uL — ABNORMAL HIGH (ref 0.1–1.0)
Monocytes Relative: 9 %
Neutro Abs: 10.8 10*3/uL — ABNORMAL HIGH (ref 1.7–7.7)
Neutrophils Relative %: 85 %
Platelets: 148 10*3/uL — ABNORMAL LOW (ref 150–400)
RBC: 2.97 MIL/uL — ABNORMAL LOW (ref 4.22–5.81)
RDW: 13.5 % (ref 11.5–15.5)
WBC: 12.8 10*3/uL — ABNORMAL HIGH (ref 4.0–10.5)
nRBC: 0 % (ref 0.0–0.2)

## 2019-05-02 LAB — COMPREHENSIVE METABOLIC PANEL
ALT: 28 U/L (ref 0–44)
AST: 38 U/L (ref 15–41)
Albumin: 2.9 g/dL — ABNORMAL LOW (ref 3.5–5.0)
Alkaline Phosphatase: 145 U/L — ABNORMAL HIGH (ref 38–126)
Anion gap: 13 (ref 5–15)
BUN: 57 mg/dL — ABNORMAL HIGH (ref 8–23)
CO2: 21 mmol/L — ABNORMAL LOW (ref 22–32)
Calcium: 8.2 mg/dL — ABNORMAL LOW (ref 8.9–10.3)
Chloride: 97 mmol/L — ABNORMAL LOW (ref 98–111)
Creatinine, Ser: 2.1 mg/dL — ABNORMAL HIGH (ref 0.61–1.24)
GFR calc Af Amer: 30 mL/min — ABNORMAL LOW (ref >60)
GFR calc non Af Amer: 26 mL/min — ABNORMAL LOW (ref >60)
Glucose, Bld: 100 mg/dL — ABNORMAL HIGH (ref 70–99)
Potassium: 4 mmol/L (ref 3.5–5.1)
Sodium: 131 mmol/L — ABNORMAL LOW (ref 135–145)
Total Bilirubin: 1.9 mg/dL — ABNORMAL HIGH (ref 0.3–1.2)
Total Protein: 5.7 g/dL — ABNORMAL LOW (ref 6.5–8.1)

## 2019-05-02 LAB — MRSA PCR SCREENING: MRSA by PCR: NEGATIVE

## 2019-05-02 LAB — HEPARIN LEVEL (UNFRACTIONATED): Heparin Unfractionated: >2.2 IU/mL — ABNORMAL HIGH (ref 0.30–0.70)

## 2019-05-02 LAB — APTT: aPTT: 155 seconds — ABNORMAL HIGH (ref 24–36)

## 2019-05-02 LAB — CK: Total CK: 75 U/L (ref 49–397)

## 2019-05-02 MED ORDER — CHLORHEXIDINE GLUCONATE CLOTH 2 % EX PADS
6.0000 | MEDICATED_PAD | Freq: Every day | CUTANEOUS | Status: DC
  Administered 2019-05-02 – 2019-05-03 (×2): 6 via TOPICAL

## 2019-05-02 MED ORDER — HEPARIN (PORCINE) 25000 UT/250ML-% IV SOLN
1200.0000 [IU]/h | INTRAVENOUS | Status: DC
  Administered 2019-05-02: 1200 [IU]/h via INTRAVENOUS
  Filled 2019-05-02: qty 250

## 2019-05-02 MED ORDER — HEPARIN BOLUS VIA INFUSION
4000.0000 [IU] | Freq: Once | INTRAVENOUS | Status: AC
  Administered 2019-05-02: 4000 [IU] via INTRAVENOUS
  Filled 2019-05-02: qty 4000

## 2019-05-02 MED ORDER — OXYCODONE-ACETAMINOPHEN 5-325 MG PO TABS
1.0000 | ORAL_TABLET | Freq: Once | ORAL | Status: AC
  Administered 2019-05-02: 23:00:00 1 via ORAL
  Filled 2019-05-02: qty 1

## 2019-05-02 MED ORDER — LABETALOL HCL 5 MG/ML IV SOLN: 10.0000 mg | INTRAVENOUS | Status: DC | PRN

## 2019-05-02 MED ORDER — LABETALOL HCL 200 MG PO TABS
100.0000 mg | ORAL_TABLET | Freq: Every morning | ORAL | Status: DC
  Filled 2019-05-02: qty 1

## 2019-05-02 MED ORDER — FUROSEMIDE 40 MG PO TABS
40.0000 mg | ORAL_TABLET | Freq: Two times a day (BID) | ORAL | Status: DC
  Filled 2019-05-02: qty 1

## 2019-05-02 MED ORDER — ORAL CARE MOUTH RINSE
15.0000 mL | Freq: Two times a day (BID) | OROMUCOSAL | Status: DC
  Administered 2019-05-02 – 2019-05-03 (×3): 15 mL via OROMUCOSAL

## 2019-05-02 MED ORDER — TAMSULOSIN HCL 0.4 MG PO CAPS
0.4000 mg | ORAL_CAPSULE | Freq: Every day | ORAL | Status: DC
  Administered 2019-05-03 – 2019-05-06 (×4): 0.4 mg via ORAL
  Filled 2019-05-02 (×5): qty 1

## 2019-05-02 MED ORDER — HEPARIN (PORCINE) 25000 UT/250ML-% IV SOLN
900.0000 [IU]/h | INTRAVENOUS | Status: DC
  Administered 2019-05-03: 10:00:00 900 [IU]/h via INTRAVENOUS
  Filled 2019-05-02: qty 250

## 2019-05-02 MED ORDER — SODIUM CHLORIDE 0.9 % IV SOLN
INTRAVENOUS | Status: DC
  Administered 2019-05-02 (×2): via INTRAVENOUS

## 2019-05-02 MED ORDER — TOBRAMYCIN 0.3 % OP SOLN
2.0000 [drp] | Freq: Four times a day (QID) | OPHTHALMIC | Status: DC
  Administered 2019-05-02 – 2019-05-06 (×17): 2 [drp] via OPHTHALMIC
  Filled 2019-05-02 (×2): qty 5

## 2019-05-02 MED ORDER — CHLORHEXIDINE GLUCONATE 0.12 % MT SOLN
15.0000 mL | Freq: Two times a day (BID) | OROMUCOSAL | Status: DC
  Administered 2019-05-03: 15 mL via OROMUCOSAL
  Filled 2019-05-02: qty 15

## 2019-05-02 MED ORDER — APIXABAN 2.5 MG PO TABS
2.5000 mg | ORAL_TABLET | Freq: Two times a day (BID) | ORAL | Status: DC
  Filled 2019-05-02: qty 1

## 2019-05-02 NOTE — Progress Notes (Signed)
Unable to assess- pt is unable to answer this question at this time

## 2019-05-02 NOTE — Plan of Care (Signed)
  Problem: Acute Rehab PT Goals(only PT should resolve) Goal: Pt Will Go Supine/Side To Sit Outcome: Progressing Flowsheets (Taken 05/02/2019 1235) Pt will go Supine/Side to Sit: with moderate assist Goal: Patient Will Transfer Sit To/From Stand Outcome: Progressing Flowsheets (Taken 05/02/2019 1235) Patient will transfer sit to/from stand:  with moderate assist  with maximum assist Goal: Pt Will Transfer Bed To Chair/Chair To Bed Outcome: Progressing Flowsheets (Taken 05/02/2019 1235) Pt will Transfer Bed to Chair/Chair to Bed:  with mod assist  with max assist Goal: Pt Will Ambulate Outcome: Progressing Flowsheets (Taken 05/02/2019 1235) Pt will Ambulate:  10 feet  with moderate assist  with maximum assist  with rolling walker   12:35 PM, 05/02/19 Lonell Grandchild, MPT Physical Therapist with [facility] [phone] office [phone] mobile phone

## 2019-05-02 NOTE — TOC Initial Note (Signed)
Transition of Care [facility](TOC) - Initial/Assessment Note    Patient Details  Name: Edwin [name] MRN: [id number] Date of Birth: [birth year]  Transition of Care [facility](TOC) CM/SW Contact:    Annice NeedySettle, Mylz Yuan D, LCSW Phone Number: 05/02/2019, 4:31 PM  Clinical Narrative:                 Patient have private duty caregivers 24/7.  At baseline, he ambulates with a walker and ADLs are completed by his cargivers. He does feed himself. [facility] however they had not had an opportunity to make the initial visit because patient was readmitted.  Son, Loraine LericheMark, is intersted in SNF, however he indicates that patient typically refuses SNF. LCSW discussed that patient is only oriented to person currently, so decisions will need to be made on his behalf until he clears.   Expected Discharge Plan: Skilled Nursing Facility Barriers to Discharge: No Barriers Identified   Patient Goals and CMS Choice Patient states their goals for this hospitalization and ongoing recovery are:: to get back to baseline CMS Medicare.gov Compare Post Acute Care list provided to:: Patient Represenative (must comment)(son, Suszanne FinchMark Dobosz)    Expected Discharge Plan and Services Expected Discharge Plan: Skilled Nursing Facility In-house Referral: Clinical Social Work   Post Acute Care Choice: Skilled Nursing Facility Living arrangements for the past 2 months: Single Family Home                                      Prior Living Arrangements/Services Living arrangements for the past 2 months: Single Family Home Lives with:: Other (Comment)(Patient has 24/7 paid caregivers) Patient language and need for interpreter reviewed:: No        Need for Family Participation in Patient Care: Yes (Comment) Care giver support system in place?: Yes (comment) Current home services: DME, Home PT, Home RN Criminal Activity/Legal Involvement Pertinent to Current Situation/Hospitalization: No - Comment as  needed  Activities of Daily Living Home Assistive Devices/Equipment: Walker (specify type) ADL Screening (condition at time of admission) Patient's cognitive ability adequate to safely complete daily activities?: No Is the patient deaf or have difficulty hearing?: No Does the patient have difficulty seeing, even when wearing glasses/contacts?: No Does the patient have difficulty concentrating, remembering, or making decisions?: Yes Patient able to express need for assistance with ADLs?: No Does the patient have difficulty dressing or bathing?: Yes Independently performs ADLs?: Yes (appropriate for developmental age) Does the patient have difficulty walking or climbing stairs?: Yes Weakness of Legs: Both Weakness of Arms/Hands: None  Permission Sought/Granted Permission sought to share information with : Family Supports                Emotional Assessment Appearance:: Appears stated age   Affect (typically observed): Unable to Assess Orientation: : Oriented to Self Alcohol / Substance Use: Not Applicable Psych Involvement: No (comment)  Admission diagnosis:  Altered mental status [R41.82] Acute cystitis with hematuria [N30.01] AKI (acute kidney injury) (HCC) [N17.9] Unilateral inguinal hernia without obstruction or gangrene, recurrence not specified [K40.90] Patient Active Problem List   Diagnosis Date Noted  . Sepsis secondary to UTI (HCC) 05/01/2019  . Leg edema, left 02/08/2018  . 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 MITRAL REGURGITATION 08/08/2010  . SUBDURAL HEMATOMA, HX OF 08/08/2010  . CARDIOVASCULAR FUNCTION STUDY, ABNORMAL 10/28/2009  . Hyperlipidemia 10/24/2009  . HYPOSMOLALITY AND/OR HYPONATREMIA 10/24/2009  .  HYPOPOTASSEMIA 10/24/2009  . Essential hypertension 10/24/2009  . ATRIAL FIBRILLATION 10/24/2009  . OTHER SPECIFIED CARDIAC DYSRHYTHMIAS 10/24/2009  . SYNCOPE AND COLLAPSE 10/24/2009  . PERSONAL HX TIA & CI  W/O RESIDUAL DEFICITS 10/24/2009   PCP:  Monico Blitz, MD Pharmacy:   [place], [place] [address] [place] [address] Phone: [phone] Fax: [phone]     Social Determinants of Health (SDOH) Interventions    Readmission Risk Interventions No flowsheet data found.

## 2019-05-02 NOTE — Progress Notes (Signed)
ANTICOAGULATION CONSULT NOTE - Initial Consult  Pharmacy Consult for heparin Indication: atrial fibrillation  Allergies  Allergen Reactions  . Amlodipine   . Lisinopril     Patient Measurements: Height: 6' (182.9 cm) Weight: 192 lb 3.9 oz (87.2 kg) IBW/kg (Calculated) : 77.6 Heparin Dosing Weight: 85 kg  Vital Signs: Temp: 99.7 F (37.6 C) (07/07 1100) Temp Source: Axillary (07/07 1100) BP: 87/69 (07/07 1100) Pulse Rate: 77 (07/07 1000)  Labs: Recent Labs    05/01/19 1700 05/02/19 0506  HGB 10.5* 9.9*  HCT 32.0* 29.8*  PLT 141* 148*  APTT 47*  --   LABPROT 23.7*  --   INR 2.2*  --   CREATININE 2.18* 2.10*  CKTOTAL  --  75    Estimated Creatinine Clearance: 22.6 mL/min (A) (by C-G formula based on SCr of 2.1 mg/dL (H)).   Medical History: Past Medical History:  Diagnosis Date  . Bradycardia    s/p SJM PPM by Dr Rayann Heman  . CHF (congestive heart failure) ([place])   . Hyperlipidemia   . Hypertension   . Peripheral neuropathy   . Permanent atrial fibrillation   . Subdural hematoma (HCC)     Medications:  Medications Prior to Admission  Medication Sig Dispense Refill Last Dose  . diphenhydrAMINE (ALLERGY MEDICATION) 25 mg capsule Take 25 mg by mouth daily. OTC allergy med   05/01/2019 at Unknown time  . ELIQUIS 2.5 MG TABS tablet TAKE 1 TABLET BY MOUTH TWICE DAILY (Patient taking differently: Take 2.5 mg by mouth 2 (two) times daily. ) 60 tablet 6 05/01/2019 at 830  . furosemide (LASIX) 40 MG tablet Take 40 mg by mouth 2 (two) times daily.    05/01/2019 at Unknown time  . gabapentin (NEURONTIN) 300 MG capsule Take 900 mg by mouth at bedtime.    Past Week at Unknown time  . hydrOXYzine (ATARAX/VISTARIL) 25 MG tablet Take 25 mg by mouth at bedtime. For itching   Past Week at Unknown time  . labetalol (NORMODYNE) 200 MG tablet Take 100-200 mg by mouth every morning.    Past Week at Unknown time  . losartan (COZAAR) 50 MG tablet Take 1 tablet (50 mg total) by mouth daily.  (Patient taking differently: Take 100 mg by mouth daily. ) 30 tablet 6 05/01/2019 at Unknown time  . oxyCODONE-acetaminophen (PERCOCET/ROXICET) 5-325 MG tablet Take 0.5-1 tablets by mouth daily as needed for moderate pain or severe pain.   04/30/2019 at Unknown time  . potassium chloride SA (K-DUR) 20 MEQ tablet Take 1 tablet (20 mEq total) by mouth daily as needed (taking 1/2 most days). Taking 1/2 tab most days (Patient taking differently: Take 20 mEq by mouth daily. Taking 1/2 tab most days) 30 tablet 2 05/01/2019 at Unknown time  . Tamsulosin HCl (FLOMAX) 0.4 MG CAPS Take 0.4 mg by mouth daily.    05/01/2019 at Unknown time  . tobramycin (TOBREX) 0.3 % ophthalmic solution Place 2 drops into the left eye 4 (four) times daily.   05/01/2019 at 830    Assessment: Pharmacy consulted to dose heparin in patient with atrial fibrillation.  Patient was on apixaban prior to admission with last dose given 7/6 0830. Will need to monitor based on aPTT until correlation with heparin levels.  Goal of Therapy:  APTT 66-102 seconds Heparin level 0.3-0.7 units/ml Monitor platelets by anticoagulation protocol: Yes   Plan:  Give 4000 units bolus x 1 Start heparin infusion at 1200 units/hr Check anti-Xa level in 8 hours and  daily while on heparin Continue to monitor H&H and platelets  Tad MooreSteven C Amara Justen 05/02/2019,11:46 AM

## 2019-05-02 NOTE — Progress Notes (Signed)
ANTICOAGULATION CONSULT NOTE - Initial Consult  Pharmacy Consult for heparin Indication: atrial fibrillation  Allergies  Allergen Reactions  . Amlodipine   . Lisinopril     Patient Measurements: Height: 6' (182.9 cm) Weight: 192 lb 3.9 oz (87.2 kg) IBW/kg (Calculated) : 77.6 Heparin Dosing Weight: 85 kg  Vital Signs: Temp: 98.2 F (36.8 C) (07/07 1700) Temp Source: Oral (07/07 1700) BP: 144/101 (07/07 1900) Pulse Rate: 63 (07/07 1900)  Labs: Recent Labs    05/01/19 1700 05/02/19 0506 05/02/19 1236 05/02/19 1926  HGB 10.5* 9.9*  --   --   HCT 32.0* 29.8*  --   --   PLT 141* 148*  --   --   APTT 47*  --   --  155*  LABPROT 23.7*  --   --   --   INR 2.2*  --   --   --   HEPARINUNFRC  --   --  >2.20*  --   CREATININE 2.18* 2.10*  --   --   CKTOTAL  --  75  --   --     Estimated Creatinine Clearance: 22.6 mL/min (A) (by C-G formula based on SCr of 2.1 mg/dL (H)).   Medical History: Past Medical History:  Diagnosis Date  . Bradycardia    s/p SJM PPM by Dr Rayann Heman  . CHF (congestive heart failure) ([address])   . Hyperlipidemia   . Hypertension   . Peripheral neuropathy   . Permanent atrial fibrillation   . Subdural hematoma (HCC)     Medications:  Medications Prior to Admission  Medication Sig Dispense Refill Last Dose  . diphenhydrAMINE (ALLERGY MEDICATION) 25 mg capsule Take 25 mg by mouth daily. OTC allergy med   05/01/2019 at Unknown time  . ELIQUIS 2.5 MG TABS tablet TAKE 1 TABLET BY MOUTH TWICE DAILY (Patient taking differently: Take 2.5 mg by mouth 2 (two) times daily. ) 60 tablet 6 05/01/2019 at 830  . furosemide (LASIX) 40 MG tablet Take 40 mg by mouth 2 (two) times daily.    05/01/2019 at Unknown time  . gabapentin (NEURONTIN) 300 MG capsule Take 900 mg by mouth at bedtime.    Past Week at Unknown time  . hydrOXYzine (ATARAX/VISTARIL) 25 MG tablet Take 25 mg by mouth at bedtime. For itching   Past Week at Unknown time  . labetalol (NORMODYNE) 200 MG tablet  Take 100-200 mg by mouth every morning.    Past Week at Unknown time  . losartan (COZAAR) 50 MG tablet Take 1 tablet (50 mg total) by mouth daily. (Patient taking differently: Take 100 mg by mouth daily. ) 30 tablet 6 05/01/2019 at Unknown time  . oxyCODONE-acetaminophen (PERCOCET/ROXICET) 5-325 MG tablet Take 0.5-1 tablets by mouth daily as needed for moderate pain or severe pain.   04/30/2019 at Unknown time  . potassium chloride SA (K-DUR) 20 MEQ tablet Take 1 tablet (20 mEq total) by mouth daily as needed (taking 1/2 most days). Taking 1/2 tab most days (Patient taking differently: Take 20 mEq by mouth daily. Taking 1/2 tab most days) 30 tablet 2 05/01/2019 at Unknown time  . Tamsulosin HCl (FLOMAX) 0.4 MG CAPS Take 0.4 mg by mouth daily.    05/01/2019 at Unknown time  . tobramycin (TOBREX) 0.3 % ophthalmic solution Place 2 drops into the left eye 4 (four) times daily.   05/01/2019 at 830    Assessment: Pharmacy consulted to dose heparin in patient with atrial fibrillation.  Patient was on apixaban  prior to admission with last dose given 7/6 0830. Will need to monitor based on aPTT until correlation with heparin levels.  APTT 155seconds, supratherapeutic, holding heparin for 1 hour restarting at half rate per protocol.Spoke with RN to monitor for signs of bleeding.   Goal of Therapy:  APTT 66-102 seconds Heparin level 0.3-0.7 units/ml Monitor platelets by anticoagulation protocol: Yes   Plan:   holding heparin for 1 hour restarting at half rate per protocol. Start heparin infusion at 600 units/hr Check anti-Xa level in 8 hours and daily while on heparin Continue to monitor H&H and platelets  Gerre PebblesGarrett Afomia Blackley 05/02/2019,8:16 PM

## 2019-05-02 NOTE — Progress Notes (Signed)
PROGRESS NOTE    Edwin IlesMitchell B Mower  YQM:[id number] DOB: [birth year] DOA: 05/01/2019 PCP: Kirstie PeriShah, Ashish, MD   Brief Narrative:  Per HPI: Edwin [name] is a 85 y.o. male with medical history significant of bradycardia, status post SJM/PPM by Dr. Johney FrameAllred, permanent atrial fibrillation, history of subdural hematoma, unspecified CHF, hyperlipidemia, hypertension, peripheral neuropathy who has been treated recently for UTI with oral antibiotics and is coming to the emergency department due to AMS.  He is unable to provide further information.  I spoke to the patient's son, who stated that he did not have further information as well.  He mentions speaking to the patient's primary caregiver, but was unable to obtain sufficient information.  Patient was admitted with sepsis secondary to UTI and started on Rocephin empirically with urine cultures pending.  Assessment & Plan:   Principal Problem:   Sepsis secondary to UTI [facility](HCC) Active Problems:   Hyperlipidemia   Essential hypertension   ATRIAL FIBRILLATION   BRADYCARDIA-TACHYCARDIA SYNDROME   Sepsis secondary to UTI (HCC) Admit to stepdown/inpatient. Continue ceftriaxone 1 g IVPB every 24 hours. Monitor CBC and CMP. Follow urine culture and sensitivity. May need to consult PT prior to discharge.  Apparently with fall at home and will check CK level.  Active Problems:   Essential hypertension Labetalol 10 mg IVP every 2 hours PRN. Resume oral antihypertensives once MS more responsive.    ATRIAL FIBRILLATION  CHA?DS?-VASc Score of at least 5. Resume Eliquis in a.m. Resume labetalol. Resume once more alert.    BRADYCARDIA-TACHYCARDIA SYNDROME Status post pacemaker placement. Resume beta-blocker now that he is more alert    Hyperlipidemia Currently not on medical therapy. Avoid statin for now while checking CK level    History of unspecified CHF Check echocardiogram. Limit IV fluid use.  Left forearm skin tear Wound  care   DVT prophylaxis: Eliquis Code Status: Full Family Communication: Son, Loraine LericheMark will be updated at [phone] Disposition Plan: Continue IV antibiotic treatment and monitor mentation with eventual PT evaluation and possible need for placement.   Consultants:   None  Procedures:   None  Antimicrobials:   Rocephin 7/6->   Subjective: Patient seen and evaluated today and is unable to give any meaningful responses in the room.  He is apparently waking up some more according to the nursing staff.  He continues to have some mild hematuria.  Objective: Vitals:   05/02/19 0500 05/02/19 0530 05/02/19 0604 05/02/19 0630  BP: 110/85 (!) 104/24 (!) 141/114 99/85  Pulse: 61 70 (!) 59 69  Resp: 15 17 15 15   Temp:      TempSrc:      SpO2: 97% 96% 96% 98%  Weight: 87.2 kg     Height:        Intake/Output Summary (Last 24 hours) at 05/02/2019 0711 Last data filed at 05/02/2019 0500 Gross per 24 hour  Intake --  Output 925 ml  Net -925 ml   Filed Weights   05/01/19 1624 05/01/19 2100 05/02/19 0500  Weight: 84.8 kg 87.2 kg 87.2 kg    Examination:  General exam: Appears calm and comfortable  Respiratory system: Clear to auscultation. Respiratory effort normal. Cardiovascular system: S1 & S2 heard, RRR.  Paced rhythm on telemetry.  No JVD, murmurs, rubs, gallops or clicks. No pedal edema. Gastrointestinal system: Abdomen is nondistended, soft and nontender. No organomegaly or masses felt. Normal bowel sounds heard. Central nervous system: Alert, but does not respond Extremities: Symmetric 5 x 5 power. Skin: No  rashes, lesions or ulcers Psychiatry: Cannot evaluate.    Data Reviewed: I have personally reviewed following labs and imaging studies  CBC: Recent Labs  Lab 05/01/19 1700 05/02/19 0506  WBC 11.8* 12.8*  NEUTROABS 10.4* 10.8*  HGB 10.5* 9.9*  HCT 32.0* 29.8*  MCV 101.3* 100.3*  PLT 141* 148*   Basic Metabolic Panel: Recent Labs  Lab 05/01/19 1700  05/02/19 0506  NA 130* 131*  K 4.5 4.0  CL 94* 97*  CO2 25 21*  GLUCOSE 112* 100*  BUN 57* 57*  CREATININE 2.18* 2.10*  CALCIUM 8.3* 8.2*   GFR: Estimated Creatinine Clearance: 22.6 mL/min (A) (by C-G formula based on SCr of 2.1 mg/dL (H)). Liver Function Tests: Recent Labs  Lab 05/01/19 1700 05/02/19 0506  AST 43* 38  ALT 33 28  ALKPHOS 142* 145*  BILITOT 2.0* 1.9*  PROT 6.0* 5.7*  ALBUMIN 3.1* 2.9*   No results for input(s): LIPASE, AMYLASE in the last 168 hours. No results for input(s): AMMONIA in the last 168 hours. Coagulation Profile: Recent Labs  Lab 05/01/19 1700  INR 2.2*   Cardiac Enzymes: No results for input(s): CKTOTAL, CKMB, CKMBINDEX, TROPONINI in the last 168 hours. BNP (last 3 results) No results for input(s): PROBNP in the last 8760 hours. HbA1C: No results for input(s): HGBA1C in the last 72 hours. CBG: No results for input(s): GLUCAP in the last 168 hours. Lipid Profile: No results for input(s): CHOL, HDL, LDLCALC, TRIG, CHOLHDL, LDLDIRECT in the last 72 hours. Thyroid Function Tests: No results for input(s): TSH, T4TOTAL, FREET4, T3FREE, THYROIDAB in the last 72 hours. Anemia Panel: No results for input(s): VITAMINB12, FOLATE, FERRITIN, TIBC, IRON, RETICCTPCT in the last 72 hours. Sepsis Labs: Recent Labs  Lab 05/01/19 1700  LATICACIDVEN 1.6    Recent Results (from the past 240 hour(s))  SARS Coronavirus 2 (CEPHEID - Performed [facility] lab), Hosp Order     Status: None   Collection Time: 05/01/19  6:39 PM   Specimen: Nasopharyngeal Swab  Result Value Ref Range Status   SARS Coronavirus 2 NEGATIVE NEGATIVE Final    Comment: (NOTE) If result is NEGATIVE SARS-CoV-2 target nucleic acids are NOT DETECTED. The SARS-CoV-2 RNA is generally detectable in upper and lower  respiratory specimens during the acute phase of infection. The lowest  concentration of SARS-CoV-2 viral copies this assay can detect is 250  copies / mL. A  negative result does not preclude SARS-CoV-2 infection  and should not be used as the sole basis for treatment or other  patient management decisions.  A negative result may occur with  improper specimen collection / handling, submission of specimen other  than nasopharyngeal swab, presence of viral mutation(s) within the  areas targeted by this assay, and inadequate number of viral copies  (<250 copies / mL). A negative result must be combined with clinical  observations, patient history, and epidemiological information. If result is POSITIVE SARS-CoV-2 target nucleic acids are DETECTED. The SARS-CoV-2 RNA is generally detectable in upper and lower  respiratory specimens dur ing the acute phase of infection.  Positive  results are indicative of active infection with SARS-CoV-2.  Clinical  correlation with patient history and other diagnostic information is  necessary to determine patient infection status.  Positive results do  not rule out bacterial infection or co-infection with other viruses. If result is PRESUMPTIVE POSTIVE SARS-CoV-2 nucleic acids MAY BE PRESENT.   A presumptive positive result was obtained on the submitted specimen  and  confirmed on repeat testing.  While 2019 novel coronavirus  (SARS-CoV-2) nucleic acids may be present in the submitted sample  additional confirmatory testing may be necessary for epidemiological  and / or clinical management purposes  to differentiate between  SARS-CoV-2 and other Sarbecovirus currently known to infect humans.  If clinically indicated additional testing with an alternate test  methodology [phone]) is advised. The SARS-CoV-2 RNA is generally  detectable in upper and lower respiratory sp ecimens during the acute  phase of infection. The expected result is Negative. Fact Sheet for Patients:  StrictlyIdeas.no Fact Sheet for Healthcare Providers: BankingDealers.co.za This test is not  yet approved or cleared by the [place] FDA and has been authorized for detection and/or diagnosis of SARS-CoV-2 by FDA under an Emergency Use Authorization (EUA).  This EUA will remain in effect (meaning this test can be used) for the duration of the COVID-19 declaration under Section 564(b)(1) of the Act, 21 U.S.C. section 360bbb-3(b)(1), unless the authorization is terminated or revoked sooner. Performed at [facility], [address] [place], [place] [address]   MRSA PCR Screening     Status: None   Collection Time: 05/01/19  8:55 PM   Specimen: Nasal Mucosa; Nasopharyngeal  Result Value Ref Range Status   MRSA by PCR NEGATIVE NEGATIVE Final    Comment:        The GeneXpert MRSA Assay (FDA approved for NASAL specimens only), is one component of a comprehensive MRSA colonization surveillance program. It is not intended to diagnose MRSA infection nor to guide or monitor treatment for MRSA infections. Performed at [facility], [address], [place], [place] [address]          Radiology Studies: Ct Abdomen Pelvis Wo Contrast  Result Date: 05/01/2019 CLINICAL DATA:  85 year old with painful right inguinal hernia and abdominal distension. Altered mental status. EXAM: CT ABDOMEN AND PELVIS WITHOUT CONTRAST TECHNIQUE: Multidetector CT imaging of the abdomen and pelvis was performed following the standard protocol without IV contrast. COMPARISON:  CT 09/24/2016 FINDINGS: Lower chest: Small bilateral pleural effusions and compressive atelectasis. Pacemaker partially included. Dense coronary artery calcifications. Mild cardiomegaly. Pleural calcifications in the right hemithorax. Additional calcified granuloma. Left Bochdalek hernia contains fat. Hepatobiliary: Motion artifact. No focal abnormality. Clips in the gallbladder fossa postcholecystectomy. No biliary dilatation. Pancreas: Motion artifact. Parenchymal atrophy. No ductal dilatation or inflammation. Spleen: Normal in size  without focal abnormality. Adrenals/Urinary Tract: Normal adrenal glands. Bilateral perinephric edema. No hydronephrosis. Urinary bladder is decompressed by Foley catheter, there is marked urinary bladder wall thickening. Right aspect of the bladder extends towards the inguinal hernia. Stomach/Bowel: Large right inguinal hernia not entirely included in the field of view, hernia contains sigmoid colon without evidence of obstruction or wall thickening. Descending and sigmoid colonic diverticulosis without diverticulitis. Moderate diffuse colonic stool burden. Normal appendix. There is no small bowel obstruction or inflammation. Stomach is nondistended. Vascular/Lymphatic: Advanced aortic and branch atherosclerosis. No aneurysm. No bulky abdominopelvic adenopathy. Reproductive: Prominent prostate gland spans 5.3 cm. Other: Fat within the left inguinal canal, no bowel involvement. Large right inguinal hernia contains sigmoid colon. No ascites or free air. Musculoskeletal: Mild flattening of T10 vertebra. Bones are under mineralized. Multilevel degenerative change. There are no acute or suspicious osseous abnormalities. IMPRESSION: 1. Large right inguinal hernia containing sigmoid colon without obstruction or inflammation. Fat within the left inguinal canal without bowel involvement. 2. Marked urinary bladder wall thickening, bladder decompressed by Foley catheter. 3. Colonic diverticulosis without diverticulitis. 4. Advanced Aortic Atherosclerosis (ICD10-I70.0).  5. Small bilateral pleural effusions and adjacent compressive atelectasis. Electronically Signed   By: Narda RutherfordMelanie  Sanford M.D.   On: 05/01/2019 20:43   Dg Chest 1 View  Result Date: 05/01/2019 CLINICAL DATA:  Altered mental status for 2 days EXAM: CHEST  1 VIEW COMPARISON:  09/24/2016 FINDINGS: Cardiac shadow is enlarged. Pacing device is noted and stable. The lungs are well aerated bilaterally. Small left pleural effusion is noted. No focal infiltrate is  seen. Old rib fractures are noted. IMPRESSION: Small left pleural effusion.  No focal infiltrate is noted. Electronically Signed   By: Alcide CleverMark  Lukens M.D.   On: 05/01/2019 18:20   Ct Head Wo Contrast  Result Date: 05/01/2019 CLINICAL DATA:  Weakness and altered mental status for 2 days. EXAM: CT HEAD WITHOUT CONTRAST TECHNIQUE: Contiguous axial images were obtained from the base of the skull through the vertex without intravenous contrast. COMPARISON:  None. FINDINGS: Brain: No evidence of acute infarction, hemorrhage, hydrocephalus, extra-axial collection or mass lesion/mass effect. Moderate brain parenchymal volume loss and deep white matter microangiopathy. Vascular: Calcific atherosclerotic disease at the skull base. Skull: Normal. Negative for fracture or focal lesion. Sinuses/Orbits: No acute finding. Other: None. IMPRESSION: 1. No acute intracranial abnormality. 2. Atrophy, chronic microvascular disease. Electronically Signed   By: Ted Mcalpineobrinka  Dimitrova M.D.   On: 05/01/2019 18:48        Scheduled Meds:  [START ON 05/03/2019] chlorhexidine  15 mL Mouth Rinse BID   Chlorhexidine Gluconate Cloth  6 each Topical Daily   mouth rinse  15 mL Mouth Rinse q12n4p   Continuous Infusions:  cefTRIAXone (ROCEPHIN)  IV       LOS: 1 day    Time spent: 30 minutes    Sultan Pargas Hoover Brunette Briellah Baik, DO [facility] Pager [phone]  If 7PM-7AM, please contact night-coverage [email or web address] Password [email or web address] 05/02/2019, 7:11 AM

## 2019-05-02 NOTE — Evaluation (Signed)
Clinical/Bedside Swallow Evaluation Patient Details  Name: Edwin [name] MRN: [id number] Date of Birth: 05/05/1921  Today's Date: 05/02/2019 Time: SLP Start Time (ACUTE ONLY): 1433 SLP Stop Time (ACUTE ONLY): 1454 SLP Time Calculation (min) (ACUTE ONLY): 21 min  Past Medical History:  Past Medical History:  Diagnosis Date  . Bradycardia    s/p SJM PPM by Dr Johney FrameAllred  . CHF (congestive heart failure) (HCC)   . Hyperlipidemia   . Hypertension   . Peripheral neuropathy   . Permanent atrial fibrillation   . Subdural hematoma [facility](HCC)    Past Surgical History:  Past Surgical History:  Procedure Laterality Date  . CHOLECYSTECTOMY    . CRANIOTOMY    . PACEMAKER INSERTION  10/11   [employer]   HPI:  Edwin DevoidMitchell B Wilsonis a 85 y.o.malewith medical history significant ofbradycardia, status postSJM/PPMby Dr. Johney FrameAllred, permanent atrial fibrillation, history of subdural hematoma, unspecified CHF, hyperlipidemia, hypertension, peripheral neuropathy who has been treated recently for UTI with oral antibiotics and is coming to the emergency department due to AMS. He is unable to provide further information. I spoke to the patient's son, who stated that he did not have further information as well.He mentions speaking to the patient's primary caregiver, but was unable to obtain sufficient information. Patient was admitted with sepsis secondary to UTI and started on Rocephin empirically with urine cultures pending. BSE requested.   Assessment / Plan / Recommendation Clinical Impression  Clinical swallow evaluation completed, however Pt very lethargic and required max cues for sustained alertness. SLP spoke with nursing staff who reported that Pt required cues for alertness with lunch, but was able to feed him soft/chopped solids and thin liquids via straw sips without signs or symptoms of aspiration. Pt with robust voice, although limited vocalizations observed today. Pt without gross facial asymmetry.  He consumed a few bites of puree and mechanical soft textures without incident, but required max verbal and tactile cues for liquid intake. Pt with reduced labial seal and only limited intake of water via straw sips and cups sips due to lethargy. Recommend D2 textures and thin liquids via cup/straw only when alert and upright, po medications whole in puree. SLP will follow during acute stay. Do not feed Pt if not able to sustain alertness. Pt will need full assist for oral care.   SLP Visit Diagnosis: Dysphagia, unspecified (R13.10)    Aspiration Risk  Mild aspiration risk;Moderate aspiration risk    Diet Recommendation Dysphagia 2 (Fine chop);Thin liquid   Liquid Administration via: Cup;Straw Medication Administration: Whole meds with puree Supervision: Staff to assist with self feeding;Full supervision/cueing for compensatory strategies Compensations: Slow rate;Small sips/bites Postural Changes: Seated upright at 90 degrees;Remain upright for at least 30 minutes after po intake    Other  Recommendations Oral Care Recommendations: Oral care BID;Staff/trained caregiver to provide oral care Other Recommendations: Clarify dietary restrictions   Follow up Recommendations 24 hour supervision/assistance      Frequency and Duration min 2x/week  1 week       Prognosis Prognosis for Safe Diet Advancement: Fair Barriers to Reach Goals: (lethargy)      Swallow Study   General Date of Onset: 05/01/19 HPI: Edwin DevoidMitchell B Wilsonis a 85 y.o.malewith medical history significant ofbradycardia, status postSJM/PPMby Dr. Johney FrameAllred, permanent atrial fibrillation, history of subdural hematoma, unspecified CHF, hyperlipidemia, hypertension, peripheral neuropathy who has been treated recently for UTI with oral antibiotics and is coming to the emergency department due to AMS. He is unable to provide further information.  I spoke to the patient's son, who stated that he did not have further information as  well.He mentions speaking to the patient's primary caregiver, but was unable to obtain sufficient information. Patient was admitted with sepsis secondary to UTI and started on Rocephin empirically with urine cultures pending. BSE requested. Type of Study: Bedside Swallow Evaluation Previous Swallow Assessment: None on record Diet Prior to this Study: Regular;Thin liquids Temperature Spikes Noted: No Respiratory Status: Room air History of Recent Intubation: No Behavior/Cognition: Lethargic/Drowsy Oral Cavity Assessment: Within Functional Limits Oral Care Completed by SLP: Recent completion by staff Oral Cavity - Dentition: Edentulous Vision: Impaired for self-feeding Self-Feeding Abilities: Total assist Patient Positioning: Upright in bed Baseline Vocal Quality: Normal Volitional Cough: Strong Volitional Swallow: Unable to elicit    Oral/Motor/Sensory Function Overall Oral Motor/Sensory Function: Within functional limits(difficult to assess due to Pt lethargy)   Ice Chips Ice chips: Within functional limits Presentation: Spoon Other Comments: verbal and tactile cues to close lips   Thin Liquid Thin Liquid: Impaired Presentation: Cup;Straw Oral Phase Impairments: Reduced labial seal;Reduced lingual movement/coordination;Poor awareness of bolus Oral Phase Functional Implications: Right anterior spillage;Left anterior spillage Pharyngeal  Phase Impairments: Suspected delayed Swallow    Nectar Thick Nectar Thick Liquid: Not tested   Honey Thick Honey Thick Liquid: Not tested   Puree Puree: Within functional limits Presentation: Spoon   Solid     Solid: Impaired Presentation: Spoon Oral Phase Impairments: Reduced lingual movement/coordination Oral Phase Functional Implications: Prolonged oral transit     Thank you,  Genene Churn, [place]  , 05/02/2019,3:22 PM

## 2019-05-02 NOTE — Evaluation (Signed)
Physical Therapy Evaluation Patient Details Name: Edwin [name] MRN: [id number] DOB: 1921-06-28 Today's Date: 05/02/2019   History of Present Illness  Edwin [name] is a 85 y.o. male with medical history significant of bradycardia, status post SJM/PPM by Dr. Rayann [name], permanent atrial fibrillation, history of subdural hematoma, unspecified CHF, hyperlipidemia, hypertension, peripheral neuropathy who has been treated recently for UTI with oral antibiotics and is coming to the emergency department due to Edwin [name].  He is unable to provide further information.  I spoke to the patient's son, who stated that he did not have further information as well.  He mentions speaking to the patient's primary caregiver, but was unable to obtain sufficient information.    Clinical Impression  Patient presents lethargic, confused, but to participate with therapy requiring constant verbal/tactile cueing.  Patient demonstrates slow labored movement for sitting up at bedside with frequent leaning/falling backwards, once scooted to bedside patient demonstrated improvement for maintaining sitting balance, but unable to attempt sit to stands due to BLE weakness.  Patient required Max/total assist to reposition when put back to bed.  Patient will benefit from continued physical therapy in hospital and recommended venue below to increase strength, balance, endurance for safe ADLs and gait.    Follow Up Recommendations SNF;Supervision for mobility/OOB;Supervision/Assistance - 24 hour    Equipment Recommendations  None recommended by PT    Recommendations for Other Services       Precautions / Restrictions Precautions Precautions: Fall Restrictions Weight Bearing Restrictions: No      Mobility  Bed Mobility Overal bed mobility: Needs Assistance Bed Mobility: Supine to Sit;Sit to Supine     Supine to sit: Max assist Sit to supine: Max assist   General bed mobility comments: slow labored movement, constant  verbal/tactile cueing  Transfers                    Ambulation/Gait                Stairs            Wheelchair Mobility    Modified Rankin (Stroke Patients Only)       Balance Overall balance assessment: Needs assistance Sitting-balance support: Feet supported;No upper extremity supported Sitting balance-Leahy Scale: Poor Sitting balance - Comments: fair/poor seated at bedside                                     Pertinent Vitals/Pain Pain Assessment: Faces Faces Pain Scale: No hurt    Home Living Family/patient expects to be discharged to:: Private residence Living Arrangements: Alone Available Help at Discharge: Personal care attendant;Available 24 hours/day             Additional Comments: Patient is poor historian    Prior Function                 Hand Dominance        Extremity/Trunk Assessment   Upper Extremity Assessment Upper Extremity Assessment: Generalized weakness    Lower Extremity Assessment Lower Extremity Assessment: Generalized weakness    Cervical / Trunk Assessment Cervical / Trunk Assessment: Kyphotic  Communication      Cognition Arousal/Alertness: Lethargic Behavior During Therapy: Restless Overall Cognitive Status: No family/caregiver present to determine baseline cognitive functioning  General Comments: Patient responded to his name, otherwise very lethargic with difficulty keeping eyes opened      General Comments      Exercises     Assessment/Plan    PT Assessment Patient needs continued PT services  PT Problem List Decreased strength;Decreased activity tolerance;Decreased balance;Decreased mobility       PT Treatment Interventions Gait training;Functional mobility training;Therapeutic activities;Therapeutic exercise;Patient/family education;Stair training    PT Goals (Current goals can be found in the Care Plan section)   Acute Rehab PT Goals Patient Stated Goal: not stated PT Goal Formulation: With patient Time For Goal Achievement: 05/16/19 Potential to Achieve Goals: Fair    Frequency Min 3X/week   Barriers to discharge        Co-evaluation               AM-PAC PT "6 Clicks" Mobility  Outcome Measure Help needed turning from your back to your side while in a flat bed without using bedrails?: A Lot Help needed moving from lying on your back to sitting on the side of a flat bed without using bedrails?: A Lot Help needed moving to and from a bed to a chair (including a wheelchair)?: Total Help needed standing up from a chair using your arms (e.g., wheelchair or bedside chair)?: Total Help needed to walk in hospital room?: Total Help needed climbing 3-5 steps with a railing? : Total 6 Click Score: 8    End of Session   Activity Tolerance: Patient tolerated treatment well;Patient limited by fatigue Patient left: in bed;with call bell/phone within reach;with bed alarm set Nurse Communication: Mobility status PT Visit Diagnosis: Unsteadiness on feet (R26.81);Other abnormalities of gait and mobility (R26.89);Muscle weakness (generalized) (M62.81)    Time: [id number] PT Time Calculation (min) (ACUTE ONLY): 24 min   Charges:   PT Evaluation $PT Eval Moderate Complexity: 1 Mod PT Treatments $Therapeutic Activity: 23-37 mins        12:33 PM, 05/02/19 Edwin [name], MPT Physical Therapist [facility] 336 [phone] office [phone] mobile phone

## 2019-05-02 NOTE — NC FL2 (Signed)
[place] LEVEL OF CARE SCREENING TOOL     IDENTIFICATION  Patient Name: Edwin [name] Birthdate: 07-18-1921 Sex: male Admission Date (Current Location): 05/01/2019  [facility] and [place] Number:  [employer] and Address:  [facility] [address] [place]      Provider Number: [id number]  Attending Physician Name and Address:  Rodena Goldmann, DO  Relative Name and Phone Number:       Current Level of Care: Hospital Recommended Level of Care: [place] Prior Approval Number:    Date Approved/Denied:   PASRR Number: [id number]  Discharge Plan: SNF    Current Diagnoses: Patient Active Problem List   Diagnosis Date Noted  . Reducible right inguinal hernia   . Sepsis secondary to UTI ([place]) 05/01/2019  . Leg edema, left 02/08/2018  . 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 MITRAL REGURGITATION 08/08/2010  . SUBDURAL HEMATOMA, HX OF 08/08/2010  . CARDIOVASCULAR FUNCTION STUDY, ABNORMAL 10/28/2009  . Hyperlipidemia 10/24/2009  . HYPOSMOLALITY AND/OR HYPONATREMIA 10/24/2009  . HYPOPOTASSEMIA 10/24/2009  . Essential hypertension 10/24/2009  . ATRIAL FIBRILLATION 10/24/2009  . OTHER SPECIFIED CARDIAC DYSRHYTHMIAS 10/24/2009  . SYNCOPE AND COLLAPSE 10/24/2009  . PERSONAL HX TIA & CI W/O RESIDUAL DEFICITS 10/24/2009    Orientation RESPIRATION BLADDER Height & Weight     Self  Normal Continent Weight: 192 lb 3.9 oz (87.2 kg) Height:  6' (182.9 cm)  BEHAVIORAL SYMPTOMS/MOOD NEUROLOGICAL BOWEL NUTRITION STATUS      Continent Diet(DYS 2 (fine chop). heart healthy. Fluid restriction 1566mL fluid.)  AMBULATORY STATUS COMMUNICATION OF NEEDS Skin   Extensive Assist Verbally Other (Comment)(Venous stasis leg ulcer; right, posterior, lower. left forearm anterior laceration. multiple areas of ecchymosis)                       Personal Care Assistance Level of  Assistance  Bathing, Feeding, Dressing Bathing Assistance: Limited assistance Feeding assistance: Independent Dressing Assistance: Limited assistance     Functional Limitations Info  Sight, Hearing, Speech Sight Info: Adequate Hearing Info: Adequate Speech Info: Adequate    SPECIAL CARE FACTORS FREQUENCY  PT (By licensed PT), Speech therapy     PT Frequency: 5x/week       Speech Therapy Frequency: 3x/week      Contractures Contractures Info: Not present    Additional Factors Info  Code Status, Allergies Code Status Info: DNR Allergies Info: Amlodipine, Lisinopril           Current Medications (05/02/2019):  This is the current hospital active medication list Current Facility-Administered Medications  Medication Dose Route Frequency Provider Last Rate Last Dose  . 0.9 %  sodium chloride infusion   Intravenous Continuous Heath Lark D, DO 75 mL/hr at 05/02/19 1153    . cefTRIAXone (ROCEPHIN) 1 g in sodium chloride 0.9 % 100 mL IVPB  1 g Intravenous Q24H Reubin Milan, MD      . Derrill Memo ON 05/03/2019] chlorhexidine (PERIDEX) 0.12 % solution 15 mL  15 mL Mouth Rinse BID Reubin Milan, MD      . Chlorhexidine Gluconate Cloth 2 % PADS 6 each  6 each Topical Daily Reubin Milan, MD   6 each at 05/02/19 0920  . heparin ADULT infusion 100 units/mL (25000 units/225mL sodium chloride 0.45%)  1,200 Units/hr Intravenous Continuous Heath Lark D, DO 12 mL/hr at 05/02/19 1223 1,200 Units/hr at 05/02/19 1223  . labetalol (NORMODYNE) injection 10  mg  10 mg Intravenous Q2H PRN Sherryll BurgerShah, Pratik D, DO      . MEDLINE mouth rinse  15 mL Mouth Rinse q12n4p Bobette Mortiz, David Manuel, MD   15 mL at 05/02/19 1636  . tamsulosin (FLOMAX) capsule 0.4 mg  0.4 mg Oral Daily Sherryll BurgerShah, Pratik D, DO      . tobramycin (TOBREX) 0.3 % ophthalmic solution 2 drop  2 drop Left Eye QID Sherryll BurgerShah, Pratik D, DO   2 drop at 05/02/19 1400     Discharge Medications: Please see discharge summary for a list of  discharge medications.  Relevant Imaging Results:  Relevant Lab Results:   Additional Information SSN [id number]  Topacio Cella, Juleen ChinaHeather D, LCSW

## 2019-05-02 NOTE — Consult Note (Addendum)
[facility] Consult  Reason for Consult: Right inguinal hernia  Referring Physician:  Dr. Sherryll BurgerShah  Chief Complaint    Abdominal Pain      Edwin [name] is a 85 y.o. male.  HPI:  Edwin [name] is a 85 yo with HTN, HLD, A fib, prior subdurals, CHF, who reportedly lives with a caregiver, and came into the ED yesterday with altered mental status and pain in his groin. The patient had prior UTIs he was treated for and the caregiver was afraid he had another UTI per report.  I saw the patient in the ED yesterday, and he was able to answer few questions but his speech was garbled. He did complain of pain in the right groin and moaned on occasion.  I was able to reduce the hernia fully in the ED and noted a large defect. I recommended continued CT of the a/p to ensure that nothing was being missed.  Patient was also noted in the ED to have several skin tears.   Today he complains of no groin pain. He is a little more awake.       Past Medical History:  Diagnosis Date  . Bradycardia    s/p SJM PPM by Dr Johney FrameAllred  . CHF (congestive heart failure) (HCC)   . Hyperlipidemia   . Hypertension   . Peripheral neuropathy   . Permanent atrial fibrillation   . Subdural hematoma [facility](HCC)     Past Surgical History:  Procedure Laterality Date  . CHOLECYSTECTOMY    . CRANIOTOMY    . PACEMAKER INSERTION  10/11   [employer]    Family History  Problem Relation Age of Onset  . Heart failure Mother   . Other Father [id number]       typhoid    Social History   Tobacco Use  . Smoking status: Former Smoker    Packs/day: 0.25    Years: 45.00    Pack years: 11.25    Types: Cigarettes    Start date: 01/08/1944    Quit date: 07/26/1989    Years since quitting: 29.7  . Smokeless tobacco: Never Used  . Tobacco comment: 07/26/14 Ocasionally (socially) smoked over 25years ago- AJ  Substance Use Topics  . Alcohol use: Yes    Alcohol/week: 0.0 standard drinks    Comment: 07/26/14- Drinks wine once a day  everyday- AJ  . Drug use: No    Medications:  I have reviewed the patient's current medications. Prior to Admission:  Medications Prior to Admission  Medication Sig Dispense Refill Last Dose  . diphenhydrAMINE (ALLERGY MEDICATION) 25 mg capsule Take 25 mg by mouth daily. OTC allergy med   05/01/2019 at Unknown time  . ELIQUIS 2.5 MG TABS tablet TAKE 1 TABLET BY MOUTH TWICE DAILY (Patient taking differently: Take 2.5 mg by mouth 2 (two) times daily. ) 60 tablet 6 05/01/2019 at 830  . furosemide (LASIX) 40 MG tablet Take 40 mg by mouth 2 (two) times daily.    05/01/2019 at Unknown time  . gabapentin (NEURONTIN) 300 MG capsule Take 900 mg by mouth at bedtime.    Past Week at Unknown time  . hydrOXYzine (ATARAX/VISTARIL) 25 MG tablet Take 25 mg by mouth at bedtime. For itching   Past Week at Unknown time  . labetalol (NORMODYNE) 200 MG tablet Take 100-200 mg by mouth every morning.    Past Week at Unknown time  . losartan (COZAAR) 50 MG tablet Take 1 tablet (50 mg total) by  mouth daily. (Patient taking differently: Take 100 mg by mouth daily. ) 30 tablet 6 05/01/2019 at Unknown time  . oxyCODONE-acetaminophen (PERCOCET/ROXICET) 5-325 MG tablet Take 0.5-1 tablets by mouth daily as needed for moderate pain or severe pain.   04/30/2019 at Unknown time  . potassium chloride SA (K-DUR) 20 MEQ tablet Take 1 tablet (20 mEq total) by mouth daily as needed (taking 1/2 most days). Taking 1/2 tab most days (Patient taking differently: Take 20 mEq by mouth daily. Taking 1/2 tab most days) 30 tablet 2 05/01/2019 at Unknown time  . Tamsulosin HCl (FLOMAX) 0.4 MG CAPS Take 0.4 mg by mouth daily.    05/01/2019 at Unknown time  . tobramycin (TOBREX) 0.3 % ophthalmic solution Place 2 drops into the left eye 4 (four) times daily.   05/01/2019 at 830   Scheduled: . [START ON 05/03/2019] chlorhexidine  15 mL Mouth Rinse BID  . Chlorhexidine Gluconate Cloth  6 each Topical Daily  . mouth rinse  15 mL Mouth Rinse q12n4p  . tamsulosin   0.4 mg Oral Daily  . tobramycin  2 drop Left Eye QID   Continuous: . sodium chloride 75 mL/hr at 05/02/19 1153  . cefTRIAXone (ROCEPHIN)  IV    . heparin 1,200 Units/hr (05/02/19 1223)   ZOX:[id number]:labetalol  Allergies  Allergen Reactions  . Amlodipine   . Lisinopril     ROS:  Review of systems not obtained due to patient factors. Garbled speech Reported right groin pain   Blood pressure 99/85, pulse 69, temperature 99 F (37.2 C), temperature source Axillary, resp. rate 15, height 6' (1.829 m), weight 87.2 kg, SpO2 98 %. Physical Exam Vitals signs reviewed.  HENT:     Head: Normocephalic.  Eyes:     Extraocular Movements: Extraocular movements intact.  Cardiovascular:     Rate and Rhythm: Normal rate.  Pulmonary:     Effort: Pulmonary effort is normal.  Abdominal:     Palpations: Abdomen is soft.     Tenderness: There is no abdominal tenderness. There is no guarding or rebound.     Hernia: A hernia is present. Hernia is present in the right inguinal area.     Comments: Slightly tender in the right inguinal region, fully reduced hernia   Skin:    General: Skin is warm and dry.  Neurological:     General: No focal deficit present.     Mental Status: He is alert. Mental status is at baseline.  Psychiatric:        Mood and Affect: Mood is not anxious or depressed.     Results: Results for orders placed or performed during the hospital encounter of 05/01/19 (from the past 48 hour(s))  Urinalysis, Routine w reflex microscopic     Status: Abnormal   Collection Time: 05/01/19  4:35 PM  Result Value Ref Range   Color, Urine AMBER (A) YELLOW    Comment: BIOCHEMICALS MAY BE AFFECTED BY COLOR   APPearance CLOUDY (A) CLEAR   Specific Gravity, Urine 1.011 1.005 - 1.030   pH 5.0 5.0 - 8.0   Glucose, UA NEGATIVE NEGATIVE mg/dL   Hgb urine dipstick LARGE (A) NEGATIVE   Bilirubin Urine NEGATIVE NEGATIVE   Ketones, ur NEGATIVE NEGATIVE mg/dL   Protein, ur 811100 (A) NEGATIVE mg/dL    Nitrite NEGATIVE NEGATIVE   Leukocytes,Ua LARGE (A) NEGATIVE   RBC / HPF 21-50 0 - 5 RBC/hpf   WBC, UA >50 (H) 0 - 5 WBC/hpf   Bacteria, UA  MANY (A) NONE SEEN   WBC Clumps PRESENT    Non Squamous Epithelial 0-5 (A) NONE SEEN    Comment: Performed at [facility], [address], [place], [place] [address]  CBC with Differential     Status: Abnormal   Collection Time: 05/01/19  5:00 PM  Result Value Ref Range   WBC 11.8 (H) 4.0 - 10.5 K/uL   RBC 3.16 (L) 4.22 - 5.81 MIL/uL   Hemoglobin 10.5 (L) 13.0 - 17.0 g/dL   HCT 60.4 (L) 54.0 - 98.1 %   MCV 101.3 (H) 80.0 - 100.0 fL   MCH 33.2 26.0 - 34.0 pg   MCHC 32.8 30.0 - 36.0 g/dL   RDW 19.1 47.8 - 29.5 %   Platelets 141 (L) 150 - 400 K/uL   nRBC 0.0 0.0 - 0.2 %   Neutrophils Relative % 87 %   Neutro Abs 10.4 (H) 1.7 - 7.7 K/uL   Lymphocytes Relative 5 %   Lymphs Abs 0.6 (L) 0.7 - 4.0 K/uL   Monocytes Relative 7 %   Monocytes Absolute 0.8 0.1 - 1.0 K/uL   Eosinophils Relative 0 %   Eosinophils Absolute 0.0 0.0 - 0.5 K/uL   Basophils Relative 0 %   Basophils Absolute 0.0 0.0 - 0.1 K/uL   Immature Granulocytes 1 %   Abs Immature Granulocytes 0.10 (H) 0.00 - 0.07 K/uL    Comment: Performed at [facility], [address], [place], [place] [address]  Comprehensive metabolic panel     Status: Abnormal   Collection Time: 05/01/19  5:00 PM  Result Value Ref Range   Sodium 130 (L) 135 - 145 mmol/L   Potassium 4.5 3.5 - 5.1 mmol/L   Chloride 94 (L) 98 - 111 mmol/L   CO2 25 22 - 32 mmol/L   Glucose, Bld 112 (H) 70 - 99 mg/dL   BUN 57 (H) 8 - 23 mg/dL   Creatinine, Ser 8.65 (H) 0.61 - 1.24 mg/dL   Calcium 8.3 (L) 8.9 - 10.3 mg/dL   Total Protein 6.0 (L) 6.5 - 8.1 g/dL   Albumin 3.1 (L) 3.5 - 5.0 g/dL   AST 43 (H) 15 - 41 U/L   ALT 33 0 - 44 U/L   Alkaline Phosphatase 142 (H) 38 - 126 U/L   Total Bilirubin 2.0 (H) 0.3 - 1.2 mg/dL   GFR calc non Af Amer 25 (L) >60 mL/min   GFR calc Af Amer 29 (L) >60 mL/min   Anion gap 11 5 - 15     Comment: Performed at [facility], [address] [place], [place] [address]  Lactic acid, plasma     Status: None   Collection Time: 05/01/19  5:00 PM  Result Value Ref Range   Lactic Acid, Venous 1.6 0.5 - 1.9 mmol/L    Comment: Performed at [facility], [address] [place], [place] [address]  Brain natriuretic peptide     Status: Abnormal   Collection Time: 05/01/19  5:00 PM  Result Value Ref Range   B Natriuretic Peptide 655.0 (H) 0.0 - 100.0 pg/mL    Comment: Performed at [facility], [address] [place], [place] [address]  APTT     Status: Abnormal   Collection Time: 05/01/19  5:00 PM  Result Value Ref Range   aPTT 47 (H) 24 - 36 seconds    Comment:        IF BASELINE aPTT IS ELEVATED, SUGGEST PATIENT RISK ASSESSMENT BE USED TO DETERMINE  APPROPRIATE ANTICOAGULANT THERAPY. Performed at [facility], [address] [place], [place] [address]   Protime-INR     Status: Abnormal   Collection Time: 05/01/19  5:00 PM  Result Value Ref Range   Prothrombin Time 23.7 (H) 11.4 - 15.2 seconds   INR 2.2 (H) 0.8 - 1.2    Comment: (NOTE) INR goal varies based on device and disease states. Performed at [facility], [address] [place], [place] [address]   SARS Coronavirus 2 (CEPHEID - Performed in [facility] hospital lab), Hosp Order     Status: None   Collection Time: 05/01/19  6:39 PM   Specimen: Nasopharyngeal Swab  Result Value Ref Range   SARS Coronavirus 2 NEGATIVE NEGATIVE    Comment: (NOTE) If result is NEGATIVE SARS-CoV-2 target nucleic acids are NOT DETECTED. The SARS-CoV-2 RNA is generally detectable in upper and lower  respiratory specimens during the acute phase of infection. The lowest  concentration of SARS-CoV-2 viral copies this assay can detect is 250  copies / mL. A negative result does not preclude SARS-CoV-2 infection  and should not be used as the sole basis for treatment or other  patient management decisions.  A negative result may occur with   improper specimen collection / handling, submission of specimen other  than nasopharyngeal swab, presence of viral mutation(s) within the  areas targeted by this assay, and inadequate number of viral copies  (<250 copies / mL). A negative result must be combined with clinical  observations, patient history, and epidemiological information. If result is POSITIVE SARS-CoV-2 target nucleic acids are DETECTED. The SARS-CoV-2 RNA is generally detectable in upper and lower  respiratory specimens dur ing the acute phase of infection.  Positive  results are indicative of active infection with SARS-CoV-2.  Clinical  correlation with patient history and other diagnostic information is  necessary to determine patient infection status.  Positive results do  not rule out bacterial infection or co-infection with other viruses. If result is PRESUMPTIVE POSTIVE SARS-CoV-2 nucleic acids MAY BE PRESENT.   A presumptive positive result was obtained on the submitted specimen  and confirmed on repeat testing.  While 2019 novel coronavirus  (SARS-CoV-2) nucleic acids may be present in the submitted sample  additional confirmatory testing may be necessary for epidemiological  and / or clinical management purposes  to differentiate between  SARS-CoV-2 and other Sarbecovirus currently known to infect humans.  If clinically indicated additional testing with an alternate test  methodology [phone]([id number]) is advised. The SARS-CoV-2 RNA is generally  detectable in upper and lower respiratory sp ecimens during the acute  phase of infection. The expected result is Negative. Fact Sheet for Patients:  BoilerBrush.com.cyhttps://www.fda.gov/media/136312/download Fact Sheet for Healthcare Providers: https://pope.com/https://www.fda.gov/media/136313/download This test is not yet approved or cleared by the [place] FDA and has been authorized for detection and/or diagnosis of SARS-CoV-2 by FDA under an Emergency Use Authorization (EUA).  This EUA will  remain in effect (meaning this test can be used) for the duration of the COVID-19 declaration under Section 564(b)(1) of the Act, 21 U.S.C. section 360bbb-3(b)(1), unless the authorization is terminated or revoked sooner. Performed at [facility], [address] [place], [place] [address]   MRSA PCR Screening     Status: None   Collection Time: 05/01/19  8:55 PM   Specimen: Nasal Mucosa; Nasopharyngeal  Result Value Ref Range   MRSA by PCR NEGATIVE NEGATIVE    Comment:        The GeneXpert MRSA Assay (  FDA approved for NASAL specimens only), is one component of a comprehensive MRSA colonization surveillance program. It is not intended to diagnose MRSA infection nor to guide or monitor treatment for MRSA infections. Performed at [facility], [address], [place], [place] [address]   CBC WITH DIFFERENTIAL     Status: Abnormal   Collection Time: 05/02/19  5:06 AM  Result Value Ref Range   WBC 12.8 (H) 4.0 - 10.5 K/uL   RBC 2.97 (L) 4.22 - 5.81 MIL/uL   Hemoglobin 9.9 (L) 13.0 - 17.0 g/dL   HCT 60.4 (L) 54.0 - 98.1 %   MCV 100.3 (H) 80.0 - 100.0 fL   MCH 33.3 26.0 - 34.0 pg   MCHC 33.2 30.0 - 36.0 g/dL   RDW 19.1 47.8 - 29.5 %   Platelets 148 (L) 150 - 400 K/uL   nRBC 0.0 0.0 - 0.2 %   Neutrophils Relative % 85 %   Neutro Abs 10.8 (H) 1.7 - 7.7 K/uL   Lymphocytes Relative 5 %   Lymphs Abs 0.6 (L) 0.7 - 4.0 K/uL   Monocytes Relative 9 %   Monocytes Absolute 1.2 (H) 0.1 - 1.0 K/uL   Eosinophils Relative 0 %   Eosinophils Absolute 0.0 0.0 - 0.5 K/uL   Basophils Relative 0 %   Basophils Absolute 0.0 0.0 - 0.1 K/uL   Immature Granulocytes 1 %   Abs Immature Granulocytes 0.17 (H) 0.00 - 0.07 K/uL    Comment: Performed at [facility], [address], [place], [place] [address]  Comprehensive metabolic panel     Status: Abnormal   Collection Time: 05/02/19  5:06 AM  Result Value Ref Range   Sodium 131 (L) 135 - 145 mmol/L   Potassium 4.0 3.5 - 5.1 mmol/L   Chloride 97  (L) 98 - 111 mmol/L   CO2 21 (L) 22 - 32 mmol/L   Glucose, Bld 100 (H) 70 - 99 mg/dL   BUN 57 (H) 8 - 23 mg/dL   Creatinine, Ser 8.65 (H) 0.61 - 1.24 mg/dL   Calcium 8.2 (L) 8.9 - 10.3 mg/dL   Total Protein 5.7 (L) 6.5 - 8.1 g/dL   Albumin 2.9 (L) 3.5 - 5.0 g/dL   AST 38 15 - 41 U/L   ALT 28 0 - 44 U/L   Alkaline Phosphatase 145 (H) 38 - 126 U/L   Total Bilirubin 1.9 (H) 0.3 - 1.2 mg/dL   GFR calc non Af Amer 26 (L) >60 mL/min   GFR calc Af Amer 30 (L) >60 mL/min   Anion gap 13 5 - 15    Comment: Performed at [facility], [address], [place], [place] [address]   Reviewed CT- large right inguinal hernia with sigmoid colon, large defect; small amount of fat in the left inguinal region, thickened bladder   Ct Abdomen Pelvis Wo Contrast  Result Date: 05/01/2019 CLINICAL DATA:  85 year old with painful right inguinal hernia and abdominal distension. Altered mental status. EXAM: CT ABDOMEN AND PELVIS WITHOUT CONTRAST TECHNIQUE: Multidetector CT imaging of the abdomen and pelvis was performed following the standard protocol without IV contrast. COMPARISON:  CT 09/24/2016 FINDINGS: Lower chest: Small bilateral pleural effusions and compressive atelectasis. Pacemaker partially included. Dense coronary artery calcifications. Mild cardiomegaly. Pleural calcifications in the right hemithorax. Additional calcified granuloma. Left Bochdalek hernia contains fat. Hepatobiliary: Motion artifact. No focal abnormality. Clips in the gallbladder fossa postcholecystectomy. No biliary dilatation. Pancreas: Motion artifact. Parenchymal atrophy. No ductal dilatation or inflammation. Spleen: Normal  in size without focal abnormality. Adrenals/Urinary Tract: Normal adrenal glands. Bilateral perinephric edema. No hydronephrosis. Urinary bladder is decompressed by Foley catheter, there is marked urinary bladder wall thickening. Right aspect of the bladder extends towards the inguinal hernia. Stomach/Bowel: Large right  inguinal hernia not entirely included in the field of view, hernia contains sigmoid colon without evidence of obstruction or wall thickening. Descending and sigmoid colonic diverticulosis without diverticulitis. Moderate diffuse colonic stool burden. Normal appendix. There is no small bowel obstruction or inflammation. Stomach is nondistended. Vascular/Lymphatic: Advanced aortic and branch atherosclerosis. No aneurysm. No bulky abdominopelvic adenopathy. Reproductive: Prominent prostate gland spans 5.3 cm. Other: Fat within the left inguinal canal, no bowel involvement. Large right inguinal hernia contains sigmoid colon. No ascites or free air. Musculoskeletal: Mild flattening of T10 vertebra. Bones are under mineralized. Multilevel degenerative change. There are no acute or suspicious osseous abnormalities. IMPRESSION: 1. Large right inguinal hernia containing sigmoid colon without obstruction or inflammation. Fat within the left inguinal canal without bowel involvement. 2. Marked urinary bladder wall thickening, bladder decompressed by Foley catheter. 3. Colonic diverticulosis without diverticulitis. 4. Advanced Aortic Atherosclerosis (ICD10-I70.0). 5. Small bilateral pleural effusions and adjacent compressive atelectasis. Electronically Signed   By: Keith Rake M.D.   On: 05/01/2019 20:43   Dg Chest 1 View  Result Date: 05/01/2019 CLINICAL DATA:  Altered mental status for 2 days EXAM: CHEST  1 VIEW COMPARISON:  09/24/2016 FINDINGS: Cardiac shadow is enlarged. Pacing device is noted and stable. The lungs are well aerated bilaterally. Small left pleural effusion is noted. No focal infiltrate is seen. Old rib fractures are noted. IMPRESSION: Small left pleural effusion.  No focal infiltrate is noted. Electronically Signed   By: Inez Catalina M.D.   On: 05/01/2019 18:20   Ct Head Wo Contrast  Result Date: 05/01/2019 CLINICAL DATA:  Weakness and altered mental status for 2 days. EXAM: CT HEAD WITHOUT  CONTRAST TECHNIQUE: Contiguous axial images were obtained from the base of the skull through the vertex without intravenous contrast. COMPARISON:  None. FINDINGS: Brain: No evidence of acute infarction, hemorrhage, hydrocephalus, extra-axial collection or mass lesion/mass effect. Moderate brain parenchymal volume loss and deep white matter microangiopathy. Vascular: Calcific atherosclerotic disease at the skull base. Skull: Normal. Negative for fracture or focal lesion. Sinuses/Orbits: No acute finding. Other: None. IMPRESSION: 1. No acute intracranial abnormality. 2. Atrophy, chronic microvascular disease. Electronically Signed   By: Fidela Salisbury M.D.   On: 05/01/2019 18:48     Assessment & Plan:  Edwin [name] is a 85 y.o. male with large right inguinal hernia that is reducible. No signs of incarceration or strangulation. The hernia defect is so large that it is unlikely to occur. Given the patient's current urosepsis and other issues, I do not think he is a good candidate for any operative intervention. The hernia defect is so large it is likely to recur.  Given his age, it is unlikely that this hernia will cause him issues as far as becoming incarcerated or obstructed. Reduced hernia yesterday and today without issues.     -No acute surgical intervention recommended  -If patient and family want to discuss hernia repair as an outpatient that is fine, can follow up with me PRN -High likelihood of recurrence given size and age if it were repaired  -Pain was more likely related to bladder irritation/ thickening given his UTI   Updated Dr. Manuella Ghazi.     Virl Cagey 05/02/2019, 7:22 AM

## 2019-05-03 LAB — APTT
aPTT: 53 seconds — ABNORMAL HIGH (ref 24–36)
aPTT: 71 seconds — ABNORMAL HIGH (ref 24–36)

## 2019-05-03 LAB — COMPREHENSIVE METABOLIC PANEL
ALT: 27 U/L (ref 0–44)
AST: 31 U/L (ref 15–41)
Albumin: 2.7 g/dL — ABNORMAL LOW (ref 3.5–5.0)
Alkaline Phosphatase: 160 U/L — ABNORMAL HIGH (ref 38–126)
Anion gap: 11 (ref 5–15)
BUN: 56 mg/dL — ABNORMAL HIGH (ref 8–23)
CO2: 21 mmol/L — ABNORMAL LOW (ref 22–32)
Calcium: 8 mg/dL — ABNORMAL LOW (ref 8.9–10.3)
Chloride: 103 mmol/L (ref 98–111)
Creatinine, Ser: 1.86 mg/dL — ABNORMAL HIGH (ref 0.61–1.24)
GFR calc Af Amer: 35 mL/min — ABNORMAL LOW (ref >60)
GFR calc non Af Amer: 30 mL/min — ABNORMAL LOW (ref >60)
Glucose, Bld: 110 mg/dL — ABNORMAL HIGH (ref 70–99)
Potassium: 3.8 mmol/L (ref 3.5–5.1)
Sodium: 135 mmol/L (ref 135–145)
Total Bilirubin: 1.4 mg/dL — ABNORMAL HIGH (ref 0.3–1.2)
Total Protein: 5.6 g/dL — ABNORMAL LOW (ref 6.5–8.1)

## 2019-05-03 LAB — CBC WITH DIFFERENTIAL/PLATELET
Abs Immature Granulocytes: 0.23 10*3/uL — ABNORMAL HIGH (ref 0.00–0.07)
Basophils Absolute: 0 10*3/uL (ref 0.0–0.1)
Basophils Relative: 0 %
Eosinophils Absolute: 0.1 10*3/uL (ref 0.0–0.5)
Eosinophils Relative: 0 %
HCT: 30.3 % — ABNORMAL LOW (ref 39.0–52.0)
Hemoglobin: 10 g/dL — ABNORMAL LOW (ref 13.0–17.0)
Immature Granulocytes: 2 %
Lymphocytes Relative: 6 %
Lymphs Abs: 0.7 10*3/uL (ref 0.7–4.0)
MCH: 32.9 pg (ref 26.0–34.0)
MCHC: 33 g/dL (ref 30.0–36.0)
MCV: 99.7 fL (ref 80.0–100.0)
Monocytes Absolute: 1.4 10*3/uL — ABNORMAL HIGH (ref 0.1–1.0)
Monocytes Relative: 12 %
Neutro Abs: 9 10*3/uL — ABNORMAL HIGH (ref 1.7–7.7)
Neutrophils Relative %: 80 %
Platelets: 135 10*3/uL — ABNORMAL LOW (ref 150–400)
RBC: 3.04 MIL/uL — ABNORMAL LOW (ref 4.22–5.81)
RDW: 14 % (ref 11.5–15.5)
WBC: 11.4 10*3/uL — ABNORMAL HIGH (ref 4.0–10.5)
nRBC: 0 % (ref 0.0–0.2)

## 2019-05-03 LAB — LACTIC ACID, PLASMA: Lactic Acid, Venous: 0.9 mmol/L (ref 0.5–1.9)

## 2019-05-03 LAB — TSH: TSH: 0.898 u[IU]/mL (ref 0.350–4.500)

## 2019-05-03 LAB — AMMONIA: Ammonia: 22 umol/L (ref 9–35)

## 2019-05-03 LAB — HEPARIN LEVEL (UNFRACTIONATED)
Heparin Unfractionated: 1.78 IU/mL — ABNORMAL HIGH (ref 0.30–0.70)
Heparin Unfractionated: 1.58 IU/mL — ABNORMAL HIGH (ref 0.30–0.70)

## 2019-05-03 MED ORDER — HEPARIN BOLUS VIA INFUSION
1500.0000 [IU] | Freq: Once | INTRAVENOUS | Status: AC
  Administered 2019-05-03: 08:00:00 1500 [IU] via INTRAVENOUS
  Filled 2019-05-03: qty 1500

## 2019-05-03 MED ORDER — ENSURE ENLIVE PO LIQD
237.0000 mL | Freq: Three times a day (TID) | ORAL | Status: DC
  Administered 2019-05-03 – 2019-05-06 (×6): 237 mL via ORAL

## 2019-05-03 MED ORDER — LACTATED RINGERS IV SOLN
INTRAVENOUS | Status: DC
  Administered 2019-05-03 – 2019-05-04 (×2): via INTRAVENOUS

## 2019-05-03 NOTE — Care Management Important Message (Signed)
Important Message  Patient Details  Name: Edwin [name] MRN: [id number] Date of Birth: 03-Oct-1921   Medicare Important Message Given:  Yes     Tommy Medal 05/03/2019, 12:21 PM

## 2019-05-03 NOTE — Progress Notes (Signed)
Initial Nutrition Assessment  DOCUMENTATION CODES:  Not applicable  INTERVENTION:  Ensure Enlive po TID, each supplement provides 350 kcal and 20 grams of protein  Greatly appreciate nursing assistance at meals  NUTRITION DIAGNOSIS:  Inadequate oral intake related to lethargy/confusion as evidenced by not having any oral intake since admit  GOAL:  Patient will meet greater than or equal to 90% of their needs  MONITOR:  Supplement acceptance, Weight trends, Diet advancement, PO intake, Skin, I & O's  REASON FOR ASSESSMENT:  Low Braden    ASSESSMENT:  85 y/o male PMHx Afib s/p PPM, CHF, HLD, HTN, CKD3-4. Presented to ED w/ weakness and AMS x2 days. Had been diagnosed with UTI 2 weeks prior to that. In ED, pt met sepsis criteria with UTI as infectional source. Admitted for management.   Pt seen due to Low Braden Score. Pt was unable to communicate w/ RD due to AMS/Lethargy. SLP entered to work with pt shortly after RD arrived and was able to get patient to take sips of liquids and bites of applesauce + gram crackers, which pt enjoyed. SLP placed pt on a D1 diet w/ TL.   After discussing with nursing and ST, it does not sound like patient had had any oral intake this admission prior to this afternoon.   Per chart, pt has private caregiver at baseline. It does not sound like listed family member is too familiar with how patient was doing at home PTA.  There is a outpatient cardiology encounter documented at end of May and at that time, pt was reportedly "going down hill", having increased SOB and BLE edema and weakness. In this encounter caregiver reports pts intake had NOT been impacted and he "does eat his meals regularly". There was no mention of weight.   Per chart, aside from a few outliers, pts weight has been stable around 185-195 for the past decade. His bed weight today is 87.1 kg or 192 lbs, again suggesting adequate intake, though HF/edema may be masking some loss.   At this  time, pt is still extremely lethargic and meal intake is anticipated to be more. He is requiring feeding assistance. Will order Ensure Enlive and monitor acceptance.   Labs: BUN/creat: 56/1.86 (57/2.18 on admit), Albumin: 2.7, elevated bili/alk phos, WBC:11.4 Meds: IVF, IV abx  Recent Labs  Lab 05/01/19 1700 05/02/19 0506 05/03/19 0436  NA 130* 131* 135  K 4.5 4.0 3.8  CL 94* 97* 103  CO2 25 21* 21*  BUN 57* 57* 56*  CREATININE 2.18* 2.10* 1.86*  CALCIUM 8.3* 8.2* 8.0*  GLUCOSE 112* 100* 110*   NUTRITION - FOCUSED PHYSICAL EXAM: Deferred.   Diet Order:   Diet Order            DIET - DYS 1 Room service appropriate? Yes; Fluid consistency: Thin  Diet effective now             EDUCATION NEEDS:  No education needs have been identified at this time  Skin:  - Large Skin Tear to L forearm - MASD to buttocks  - Venous stasis Ulcer to right leg    Last BM:  Unknown. PTA  Height:  Ht Readings from Last 1 Encounters:  05/01/19 6' (1.829 m)   Weight:  Wt Readings from Last 1 Encounters:  05/03/19 87.1 kg   Wt Readings from Last 10 Encounters:  05/03/19 87.1 kg  03/21/19 84.8 kg  11/25/18 76.7 kg  11/11/18 80.3 kg  11/04/18 72.6 kg  04/01/18 86.2 kg  02/08/18 88.9 kg  07/30/17 85.3 kg  06/03/17 88.9 kg  07/31/16 86.2 kg   Ideal Body Weight:  80.91 kg  BMI:  Body mass index is 26.04 kg/m.  Estimated Nutritional Needs:  Kcal:  1750-2000 kcals (20-23 kcal/kg bw) Protein:  90-105g Pro (1.1-1.3g/kg bw) Fluid:  1.8-2L fluid (30m/kcal)  NBurtis JunesRD, LDN, CNSC Clinical Nutrition Available Tues-Sat via Pager: [phone]/05/2019 3:44 PM

## 2019-05-03 NOTE — Progress Notes (Signed)
Entered pt room at 2045. Pt has gown, mitts, telemetry removed and pulling at IV lines. Attempting to stop pt from removing lines and pt becomes combative. Pt also pulling at urinary catheter. Attempts to redirect unsuccessful.  Tylene Fantasia contacted via [facility] page system. New orders given.  Non violent restraints applied at 2100

## 2019-05-03 NOTE — Progress Notes (Signed)
ANTICOAGULATION CONSULT NOTE - Initial Consult  Pharmacy Consult for heparin Indication: atrial fibrillation  Allergies  Allergen Reactions  . Amlodipine   . Lisinopril     Patient Measurements: Height: 6' (182.9 cm) Weight: 192 lb 0.3 oz (87.1 kg) IBW/kg (Calculated) : 77.6 Heparin Dosing Weight: 85 kg  Vital Signs: Temp: 98.9 F (37.2 C) (07/08 0400) Temp Source: Axillary (07/08 0400) BP: 143/72 (07/08 0600) Pulse Rate: 61 (07/08 0600)  Labs: Recent Labs    05/01/19 1700 05/02/19 0506 05/02/19 1236 05/02/19 1926 05/03/19 0436  HGB 10.5* 9.9*  --   --  10.0*  HCT 32.0* 29.8*  --   --  30.3*  PLT 141* 148*  --   --  135*  APTT 47*  --   --  155* 53*  LABPROT 23.7*  --   --   --   --   INR 2.2*  --   --   --   --   HEPARINUNFRC  --   --  >2.20*  --  1.78*  CREATININE 2.18* 2.10*  --   --  1.86*  CKTOTAL  --  75  --   --   --     Estimated Creatinine Clearance: 25.5 mL/min (A) (by C-G formula based on SCr of 1.86 mg/dL (H)).   Medical History: Past Medical History:  Diagnosis Date  . Bradycardia    s/p SJM PPM by Dr Johney FrameAllred  . CHF (congestive heart failure) (HCC)   . Hyperlipidemia   . Hypertension   . Peripheral neuropathy   . Permanent atrial fibrillation   . Subdural hematoma (HCC)     Medications:  Medications Prior to Admission  Medication Sig Dispense Refill Last Dose  . diphenhydrAMINE (ALLERGY MEDICATION) 25 mg capsule Take 25 mg by mouth daily. OTC allergy med   05/01/2019 at Unknown time  . ELIQUIS 2.5 MG TABS tablet TAKE 1 TABLET BY MOUTH TWICE DAILY (Patient taking differently: Take 2.5 mg by mouth 2 (two) times daily. ) 60 tablet 6 05/01/2019 at 830  . furosemide (LASIX) 40 MG tablet Take 40 mg by mouth 2 (two) times daily.    05/01/2019 at Unknown time  . gabapentin (NEURONTIN) 300 MG capsule Take 900 mg by mouth at bedtime.    Past Week at Unknown time  . hydrOXYzine (ATARAX/VISTARIL) 25 MG tablet Take 25 mg by mouth at bedtime. For itching    Past Week at Unknown time  . labetalol (NORMODYNE) 200 MG tablet Take 100-200 mg by mouth every morning.    Past Week at Unknown time  . losartan (COZAAR) 50 MG tablet Take 1 tablet (50 mg total) by mouth daily. (Patient taking differently: Take 100 mg by mouth daily. ) 30 tablet 6 05/01/2019 at Unknown time  . oxyCODONE-acetaminophen (PERCOCET/ROXICET) 5-325 MG tablet Take 0.5-1 tablets by mouth daily as needed for moderate pain or severe pain.   04/30/2019 at Unknown time  . potassium chloride SA (K-DUR) 20 MEQ tablet Take 1 tablet (20 mEq total) by mouth daily as needed (taking 1/2 most days). Taking 1/2 tab most days (Patient taking differently: Take 20 mEq by mouth daily. Taking 1/2 tab most days) 30 tablet 2 05/01/2019 at Unknown time  . Tamsulosin HCl (FLOMAX) 0.4 MG CAPS Take 0.4 mg by mouth daily.    05/01/2019 at Unknown time  . tobramycin (TOBREX) 0.3 % ophthalmic solution Place 2 drops into the left eye 4 (four) times daily.   05/01/2019 at 830  Assessment: Pharmacy consulted to dose heparin in patient with atrial fibrillation.  Patient was on apixaban prior to admission with last dose given 7/6 0830. Will need to monitor based on aPTT until correlation with heparin levels.. APTT 155>> 53seconds, subtherapeutic. Heparin infusion was cut in half. Will rebolus and adjust rate. HL and APTT still not correlating   Goal of Therapy:  APTT 66-102 seconds Heparin level 0.3-0.7 units/ml Monitor platelets by anticoagulation protocol: Yes   Plan:  Heparin bolus 1500 units then increase  Heparin infusion to 900 units/hr Check APTT and anti-Xa level in 8 hours and daily while on heparin Continue to monitor H&H and platelets  Isac Sarna, BS [place], BCPS Clinical Pharmacist Pager [phone] 05/03/2019,7:36 AM

## 2019-05-03 NOTE — Progress Notes (Signed)
ANTICOAGULATION CONSULT NOTE - follow up [place] for heparin Indication: atrial fibrillation  Allergies  Allergen Reactions  . Amlodipine   . Lisinopril     Patient Measurements: Height: 6' (182.9 cm) Weight: 192 lb 0.3 oz (87.1 kg) IBW/kg (Calculated) : 77.6 Heparin Dosing Weight: 85 kg  Vital Signs: Temp: 98.7 F (37.1 C) (07/08 0744) Temp Source: Axillary (07/08 0744) BP: 144/72 (07/08 1126) Pulse Rate: 68 (07/08 1126)  Labs: Recent Labs    05/01/19 1700 05/02/19 0506 05/02/19 1236 05/02/19 1926 05/03/19 0436 05/03/19 1536  HGB 10.5* 9.9*  --   --  10.0*  --   HCT 32.0* 29.8*  --   --  30.3*  --   PLT 141* 148*  --   --  135*  --   APTT 47*  --   --  155* 53* 71*  LABPROT 23.7*  --   --   --   --   --   INR 2.2*  --   --   --   --   --   HEPARINUNFRC  --   --  >2.20*  --  1.78* 1.58*  CREATININE 2.18* 2.10*  --   --  1.86*  --   CKTOTAL  --  75  --   --   --   --     Estimated Creatinine Clearance: 25.5 mL/min (A) (by C-G formula based on SCr of 1.86 mg/dL (H)).   Medical History: Past Medical History:  Diagnosis Date  . Bradycardia    s/p SJM PPM by Dr Rayann Heman  . CHF (congestive heart failure) ([place])   . Hyperlipidemia   . Hypertension   . Peripheral neuropathy   . Permanent atrial fibrillation   . Subdural hematoma (HCC)     Medications:  Medications Prior to Admission  Medication Sig Dispense Refill Last Dose  . diphenhydrAMINE (ALLERGY MEDICATION) 25 mg capsule Take 25 mg by mouth daily. OTC allergy med   05/01/2019 at Unknown time  . ELIQUIS 2.5 MG TABS tablet TAKE 1 TABLET BY MOUTH TWICE DAILY (Patient taking differently: Take 2.5 mg by mouth 2 (two) times daily. ) 60 tablet 6 05/01/2019 at 830  . furosemide (LASIX) 40 MG tablet Take 40 mg by mouth 2 (two) times daily.    05/01/2019 at Unknown time  . gabapentin (NEURONTIN) 300 MG capsule Take 900 mg by mouth at bedtime.    Past Week at Unknown time  . hydrOXYzine (ATARAX/VISTARIL)  25 MG tablet Take 25 mg by mouth at bedtime. For itching   Past Week at Unknown time  . labetalol (NORMODYNE) 200 MG tablet Take 100-200 mg by mouth every morning.    Past Week at Unknown time  . losartan (COZAAR) 50 MG tablet Take 1 tablet (50 mg total) by mouth daily. (Patient taking differently: Take 100 mg by mouth daily. ) 30 tablet 6 05/01/2019 at Unknown time  . oxyCODONE-acetaminophen (PERCOCET/ROXICET) 5-325 MG tablet Take 0.5-1 tablets by mouth daily as needed for moderate pain or severe pain.   04/30/2019 at Unknown time  . potassium chloride SA (K-DUR) 20 MEQ tablet Take 1 tablet (20 mEq total) by mouth daily as needed (taking 1/2 most days). Taking 1/2 tab most days (Patient taking differently: Take 20 mEq by mouth daily. Taking 1/2 tab most days) 30 tablet 2 05/01/2019 at Unknown time  . Tamsulosin HCl (FLOMAX) 0.4 MG CAPS Take 0.4 mg by mouth daily.    05/01/2019 at Unknown time  .  tobramycin (TOBREX) 0.3 % ophthalmic solution Place 2 drops into the left eye 4 (four) times daily.   05/01/2019 at 830    Assessment: Pharmacy consulted to dose heparin in patient with atrial fibrillation.  Patient was on apixaban prior to admission with last dose given 7/6 0830. Will need to monitor based on aPTT until correlation with heparin levels.. APTT is therapeutic at 71. APTT still not correlating with heparin level  Goal of Therapy:  APTT 66-102 seconds Heparin level 0.3-0.7 units/ml Monitor platelets by anticoagulation protocol: Yes   Plan:  Continue Heparin infusion at 900 units/hr Check APTT and anti-Xa level in 8 hours and daily while on heparin Continue to monitor H&H and platelets  Elder CyphersLorie Debe Anfinson, BS Loura BackPharm D, BCPS Clinical Pharmacist Pager [phone] 05/03/2019,6:17 PM

## 2019-05-03 NOTE — Progress Notes (Signed)
  Speech Language Pathology Treatment: Dysphagia  Patient Details Name: Edwin [name] MRN: [id number] DOB: 10-25-1921 Today's Date: 05/03/2019 Time: 6553-7482 SLP Time Calculation (min) (ACUTE ONLY): 30 min  Assessment / Plan / Recommendation Clinical Impression  Pt seen at bedside for ongoing diagnostic dysphagia intervention. Pt with improved alertness this date and more talkative. Pt continues to have difficulty following directions consistently and suspect negatively impacted by hearing, mask, and question low vision. Pt with xerostomia which improved with ice chips and Pt verbalized, "good". Pt with suspected oropharyngeal dysphagia in setting of generalized weakness and lethargy. Pt with reduced labial closure when unable to self present liquids, performance improved when SLP provided hand over hand assist for self feeding liquids. Pt demonstrated impaired mastication and oral clearance with solids resulting in lingual residue and delayed cough. Will downgrade to D1/puree and continue with thin liquids and po meds whole in puree as able. Pt will need feeder assist, however should be encouraged to self present liquids with hand over hand assist. SLP will follow. Above to RN.     HPI HPI: Edwin [name] a 85 y.o.malewith medical history significant ofbradycardia, status postSJM/PPMby Dr. Rayann Heman, permanent atrial fibrillation, history of subdural hematoma, unspecified CHF, hyperlipidemia, hypertension, peripheral neuropathy who has been treated recently for UTI with oral antibiotics and is coming to the emergency department due to [place]. He is unable to provide further information. I spoke to the patient's son, who stated that he did not have further information as well.He mentions speaking to the patient's primary caregiver, but was unable to obtain sufficient information. Patient was admitted with sepsis secondary to UTI and started on Rocephin empirically with urine cultures pending.  BSE requested.      SLP Plan  Continue with current plan of care       Recommendations  Diet recommendations: Dysphagia 1 (puree);Thin liquid Liquids provided via: Cup;Straw Medication Administration: Whole meds with puree Supervision: Staff to assist with self feeding;Full supervision/cueing for compensatory strategies Compensations: Slow rate;Small sips/bites Postural Changes and/or Swallow Maneuvers: Seated upright 90 degrees;Upright 30-60 min after meal                Oral Care Recommendations: Oral care BID;Staff/trained caregiver to provide oral care Follow up Recommendations: 24 hour supervision/assistance SLP Visit Diagnosis: Dysphagia, unspecified (R13.10) Plan: Continue with current plan of care       Thank you,  Genene Churn, [place]                 [place] 05/03/2019, 1:23 PM

## 2019-05-03 NOTE — Progress Notes (Addendum)
PROGRESS NOTE    Edwin [name]  KGU:[id number] DOB: 30-Aug-1921 DOA: 05/01/2019 PCP: Edwin Blitz, MD   Brief Narrative:  Per HPI: Edwin [name] a 85 y.o.malewith medical history significant ofbradycardia, status postSJM/PPMby Dr. Rayann Heman, permanent atrial fibrillation, history of subdural hematoma, unspecified CHF, hyperlipidemia, hypertension, peripheral neuropathy who has been treated recently for UTI with oral antibiotics and is coming to the emergency department due to Edwin [name]. He is unable to provide further information. I spoke to the patient's son, who stated that he did not have further information as well.He mentions speaking to the patient's primary caregiver, but was unable to obtain sufficient information.  Patient was admitted with sepsis secondary to UTI and started on Rocephin empirically with urine cultures pending.   Assessment & Plan:   Principal Problem:   Sepsis secondary to UTI [facility]) Active Problems:   Hyperlipidemia   Essential hypertension   ATRIAL FIBRILLATION   BRADYCARDIA-TACHYCARDIA SYNDROME   Reducible right inguinal hernia   Acute on chronic metabolic encephalopathy sepsis secondary to UTI (HCC)/appears to have dementia Okay for transfer to telemetry today Continue ceftriaxone 1 g IVPB every 24 hours. Monitor CBC and BMP Follow urine culture and sensitivity. PT evaluation with need for SNF noted Currently on dysphagia 2 diet patient will wait Check TSH and ammonia level Will consider repeat CT head if no improvement noted by a.m. Patient cannot have MRI due to pacemaker  Active Problems: Essential hypertension Labetalol 10 mg IVP every 2 hours PRN. Resume oral antihypertensives once MS more responsive.  ATRIAL FIBRILLATION CHA?DS?-VASc Scoreof at least 5. Continue on labetalol as needed Continue heparin drip as patient is now needing Resume once more alert.  BRADYCARDIA-TACHYCARDIA SYNDROME Status post pacemaker  placement. Resume beta-blocker now that he is more alert and tolerating diet  Hyperlipidemia Currently not on medical therapy. CK 75  History of unspecified CHF Check echocardiogram. Continue higher rate of IV fluids and monitor carefully with I's and O's and daily weights Currently appears euvolemic  Left forearm skin tear Wound care  AKI vs CKD stage III/IV Increase IV fluid rate to 100 cc/h Appears to be having good urine output Monitor repeat BMP Avoid nephrotoxic agents   DVT prophylaxis: Eliquis Code Status: Full Family Communication: Son, Edwin [name] will be updated at [phone] Disposition Plan: Continue IV antibiotic treatment and transfer to telemetry and continue current treatment.  Anticipate transfer to SNF once eating and more awake/alert.  Await urine cultures.   Consultants:   None  Procedures:   None  Antimicrobials:   Rocephin 7/6->  Subjective: Patient seen and evaluated today with no new acute complaints or concerns. No acute concerns or events noted overnight.  He continues to remain poorly responsive, but alert.  He did participate with PT on 7/7 and did participate in swallow evaluation, but unfortunately remained fairly somnolent after that.  Objective: Vitals:   05/03/19 0200 05/03/19 0400 05/03/19 0500 05/03/19 0600  BP: 138/63 (!) 147/65  (!) 143/72  Pulse:    61  Resp: 14 14  14   Temp:  98.9 F (37.2 C)    TempSrc:  Axillary    SpO2:    100%  Weight:   87.1 kg   Height:        Intake/Output Summary (Last 24 hours) at 05/03/2019 0703 Last data filed at 05/02/2019 1710 Gross per 24 hour  Intake 312.55 ml  Output 950 ml  Net -637.45 ml   Filed Weights   05/01/19 2100 05/02/19 0500 05/03/19 0500  Weight: 87.2 kg 87.2 kg 87.1 kg    Examination:  General exam: Appears calm and comfortable  Respiratory system: Clear to auscultation. Respiratory effort normal.  Currently on 2 L nasal cannula Cardiovascular system: S1 &  S2 heard, RRR.  Paced rhythm.  No JVD, murmurs, rubs, gallops or clicks. No pedal edema. Gastrointestinal system: Abdomen is nondistended, soft and nontender. No organomegaly or masses felt. Normal bowel sounds heard. Central nervous system: Alert and awake, but poorly responsive. Extremities: Symmetric 5 x 5 power. Skin: No rashes, lesions or ulcers Psychiatry: Cannot be appropriately addressed.    Data Reviewed: I have personally reviewed following labs and imaging studies  CBC: Recent Labs  Lab 05/01/19 1700 05/02/19 0506 05/03/19 0436  WBC 11.8* 12.8* 11.4*  NEUTROABS 10.4* 10.8* 9.0*  HGB 10.5* 9.9* 10.0*  HCT 32.0* 29.8* 30.3*  MCV 101.3* 100.3* 99.7  PLT 141* 148* 135*   Basic Metabolic Panel: Recent Labs  Lab 05/01/19 1700 05/02/19 0506 05/03/19 0436  NA 130* 131* 135  K 4.5 4.0 3.8  CL 94* 97* 103  CO2 25 21* 21*  GLUCOSE 112* 100* 110*  BUN 57* 57* 56*  CREATININE 2.18* 2.10* 1.86*  CALCIUM 8.3* 8.2* 8.0*   GFR: Estimated Creatinine Clearance: 25.5 mL/min (A) (by C-G formula based on SCr of 1.86 mg/dL (H)). Liver Function Tests: Recent Labs  Lab 05/01/19 1700 05/02/19 0506 05/03/19 0436  AST 43* 38 31  ALT 33 28 27  ALKPHOS 142* 145* 160*  BILITOT 2.0* 1.9* 1.4*  PROT 6.0* 5.7* 5.6*  ALBUMIN 3.1* 2.9* 2.7*   No results for input(s): LIPASE, AMYLASE in the last 168 hours. No results for input(s): AMMONIA in the last 168 hours. Coagulation Profile: Recent Labs  Lab 05/01/19 1700  INR 2.2*   Cardiac Enzymes: Recent Labs  Lab 05/02/19 0506  CKTOTAL 75   BNP (last 3 results) No results for input(s): PROBNP in the last 8760 hours. HbA1C: No results for input(s): HGBA1C in the last 72 hours. CBG: No results for input(s): GLUCAP in the last 168 hours. Lipid Profile: No results for input(s): CHOL, HDL, LDLCALC, TRIG, CHOLHDL, LDLDIRECT in the last 72 hours. Thyroid Function Tests: No results for input(s): TSH, T4TOTAL, FREET4, T3FREE,  THYROIDAB in the last 72 hours. Anemia Panel: No results for input(s): VITAMINB12, FOLATE, FERRITIN, TIBC, IRON, RETICCTPCT in the last 72 hours. Sepsis Labs: Recent Labs  Lab 05/01/19 1700 05/03/19 0436  LATICACIDVEN 1.6 0.9    Recent Results (from the past 240 hour(s))  SARS Coronavirus 2 (CEPHEID - Performed in [facility] hospital lab), Hosp Order     Status: None   Collection Time: 05/01/19  6:39 PM   Specimen: Nasopharyngeal Swab  Result Value Ref Range Status   SARS Coronavirus 2 NEGATIVE NEGATIVE Final    Comment: (NOTE) If result is NEGATIVE SARS-CoV-2 target nucleic acids are NOT DETECTED. The SARS-CoV-2 RNA is generally detectable in upper and lower  respiratory specimens during the acute phase of infection. The lowest  concentration of SARS-CoV-2 viral copies this assay can detect is 250  copies / mL. A negative result does not preclude SARS-CoV-2 infection  and should not be used as the sole basis for treatment or other  patient management decisions.  A negative result may occur with  improper specimen collection / handling, submission of specimen other  than nasopharyngeal swab, presence of viral mutation(s) within the  areas targeted by this assay, and inadequate number of viral copies  (<250  copies / mL). A negative result must be combined with clinical  observations, patient history, and epidemiological information. If result is POSITIVE SARS-CoV-2 target nucleic acids are DETECTED. The SARS-CoV-2 RNA is generally detectable in upper and lower  respiratory specimens dur ing the acute phase of infection.  Positive  results are indicative of active infection with SARS-CoV-2.  Clinical  correlation with patient history and other diagnostic information is  necessary to determine patient infection status.  Positive results do  not rule out bacterial infection or co-infection with other viruses. If result is PRESUMPTIVE POSTIVE SARS-CoV-2 nucleic acids MAY BE  PRESENT.   A presumptive positive result was obtained on the submitted specimen  and confirmed on repeat testing.  While 2019 novel coronavirus  (SARS-CoV-2) nucleic acids may be present in the submitted sample  additional confirmatory testing may be necessary for epidemiological  and / or clinical management purposes  to differentiate between  SARS-CoV-2 and other Sarbecovirus currently known to infect humans.  If clinically indicated additional testing with an alternate test  methodology [phone]([id number]) is advised. The SARS-CoV-2 RNA is generally  detectable in upper and lower respiratory sp ecimens during the acute  phase of infection. The expected result is Negative. Fact Sheet for Patients:  BoilerBrush.com.cyhttps://www.fda.gov/media/136312/download Fact Sheet for Healthcare Providers: https://pope.com/https://www.fda.gov/media/136313/download This test is not yet approved or cleared by the [place] FDA and has been authorized for detection and/or diagnosis of SARS-CoV-2 by FDA under an Emergency Use Authorization (EUA).  This EUA will remain in effect (meaning this test can be used) for the duration of the COVID-19 declaration under Section 564(b)(1) of the Act, 21 U.S.C. section 360bbb-3(b)(1), unless the authorization is terminated or revoked sooner. Performed at [facility], [address] [place], [place] [address]   MRSA PCR Screening     Status: None   Collection Time: 05/01/19  8:55 PM   Specimen: Nasal Mucosa; Nasopharyngeal  Result Value Ref Range Status   MRSA by PCR NEGATIVE NEGATIVE Final    Comment:        The GeneXpert MRSA Assay (FDA approved for NASAL specimens only), is one component of a comprehensive MRSA colonization surveillance program. It is not intended to diagnose MRSA infection nor to guide or monitor treatment for MRSA infections. Performed at [facility], [address] [place], [place] [address]          Radiology Studies: Ct Abdomen Pelvis Wo Contrast  Result  Date: 05/01/2019 CLINICAL DATA:  85 year old with painful right inguinal hernia and abdominal distension. Altered mental status. EXAM: CT ABDOMEN AND PELVIS WITHOUT CONTRAST TECHNIQUE: Multidetector CT imaging of the abdomen and pelvis was performed following the standard protocol without IV contrast. COMPARISON:  CT 09/24/2016 FINDINGS: Lower chest: Small bilateral pleural effusions and compressive atelectasis. Pacemaker partially included. Dense coronary artery calcifications. Mild cardiomegaly. Pleural calcifications in the right hemithorax. Additional calcified granuloma. Left Bochdalek hernia contains fat. Hepatobiliary: Motion artifact. No focal abnormality. Clips in the gallbladder fossa postcholecystectomy. No biliary dilatation. Pancreas: Motion artifact. Parenchymal atrophy. No ductal dilatation or inflammation. Spleen: Normal in size without focal abnormality. Adrenals/Urinary Tract: Normal adrenal glands. Bilateral perinephric edema. No hydronephrosis. Urinary bladder is decompressed by Foley catheter, there is marked urinary bladder wall thickening. Right aspect of the bladder extends towards the inguinal hernia. Stomach/Bowel: Large right inguinal hernia not entirely included in the field of view, hernia contains sigmoid colon without evidence of obstruction or wall thickening. Descending and sigmoid colonic diverticulosis without diverticulitis. Moderate diffuse colonic stool burden.  Normal appendix. There is no small bowel obstruction or inflammation. Stomach is nondistended. Vascular/Lymphatic: Advanced aortic and branch atherosclerosis. No aneurysm. No bulky abdominopelvic adenopathy. Reproductive: Prominent prostate gland spans 5.3 cm. Other: Fat within the left inguinal canal, no bowel involvement. Large right inguinal hernia contains sigmoid colon. No ascites or free air. Musculoskeletal: Mild flattening of T10 vertebra. Bones are under mineralized. Multilevel degenerative change. There are no  acute or suspicious osseous abnormalities. IMPRESSION: 1. Large right inguinal hernia containing sigmoid colon without obstruction or inflammation. Fat within the left inguinal canal without bowel involvement. 2. Marked urinary bladder wall thickening, bladder decompressed by Foley catheter. 3. Colonic diverticulosis without diverticulitis. 4. Advanced Aortic Atherosclerosis (ICD10-I70.0). 5. Small bilateral pleural effusions and adjacent compressive atelectasis. Electronically Signed   By: Narda RutherfordMelanie  Sanford M.D.   On: 05/01/2019 20:43   Dg Chest 1 View  Result Date: 05/01/2019 CLINICAL DATA:  Altered mental status for 2 days EXAM: CHEST  1 VIEW COMPARISON:  09/24/2016 FINDINGS: Cardiac shadow is enlarged. Pacing device is noted and stable. The lungs are well aerated bilaterally. Small left pleural effusion is noted. No focal infiltrate is seen. Old rib fractures are noted. IMPRESSION: Small left pleural effusion.  No focal infiltrate is noted. Electronically Signed   By: Alcide CleverMark  Lukens M.D.   On: 05/01/2019 18:20   Ct Head Wo Contrast  Result Date: 05/01/2019 CLINICAL DATA:  Weakness and altered mental status for 2 days. EXAM: CT HEAD WITHOUT CONTRAST TECHNIQUE: Contiguous axial images were obtained from the base of the skull through the vertex without intravenous contrast. COMPARISON:  None. FINDINGS: Brain: No evidence of acute infarction, hemorrhage, hydrocephalus, extra-axial collection or mass lesion/mass effect. Moderate brain parenchymal volume loss and deep white matter microangiopathy. Vascular: Calcific atherosclerotic disease at the skull base. Skull: Normal. Negative for fracture or focal lesion. Sinuses/Orbits: No acute finding. Other: None. IMPRESSION: 1. No acute intracranial abnormality. 2. Atrophy, chronic microvascular disease. Electronically Signed   By: Ted Mcalpineobrinka  Dimitrova M.D.   On: 05/01/2019 18:48        Scheduled Meds:  chlorhexidine  15 mL Mouth Rinse BID   Chlorhexidine  Gluconate Cloth  6 each Topical Daily   mouth rinse  15 mL Mouth Rinse q12n4p   tamsulosin  0.4 mg Oral Daily   tobramycin  2 drop Left Eye QID   Continuous Infusions:  sodium chloride 75 mL/hr at 05/02/19 1850   cefTRIAXone (ROCEPHIN)  IV 1 g (05/02/19 1851)   heparin 600 Units/hr (05/02/19 2118)     LOS: 2 days    Time spent: 30 minutes    Tyrell Brereton Hoover Brunette Teofil Maniaci, DO [facility] Pager [phone]  If 7PM-7AM, please contact night-coverage [email or web address] Password [email or web address] 05/03/2019, 7:03 AM

## 2019-05-04 LAB — CBC WITH DIFFERENTIAL/PLATELET
Abs Immature Granulocytes: 0.48 10*3/uL — ABNORMAL HIGH (ref 0.00–0.07)
Basophils Absolute: 0 10*3/uL (ref 0.0–0.1)
Basophils Relative: 0 %
Eosinophils Absolute: 0.1 10*3/uL (ref 0.0–0.5)
Eosinophils Relative: 0 %
HCT: 30.5 % — ABNORMAL LOW (ref 39.0–52.0)
Hemoglobin: 10 g/dL — ABNORMAL LOW (ref 13.0–17.0)
Immature Granulocytes: 3 %
Lymphocytes Relative: 7 %
Lymphs Abs: 1 10*3/uL (ref 0.7–4.0)
MCH: 33.1 pg (ref 26.0–34.0)
MCHC: 32.8 g/dL (ref 30.0–36.0)
MCV: 101 fL — ABNORMAL HIGH (ref 80.0–100.0)
Monocytes Absolute: 1.9 10*3/uL — ABNORMAL HIGH (ref 0.1–1.0)
Monocytes Relative: 13 %
Neutro Abs: 10.8 10*3/uL — ABNORMAL HIGH (ref 1.7–7.7)
Neutrophils Relative %: 77 %
Platelets: 146 10*3/uL — ABNORMAL LOW (ref 150–400)
RBC: 3.02 MIL/uL — ABNORMAL LOW (ref 4.22–5.81)
RDW: 13.9 % (ref 11.5–15.5)
WBC: 14.3 10*3/uL — ABNORMAL HIGH (ref 4.0–10.5)
nRBC: 0 % (ref 0.0–0.2)

## 2019-05-04 LAB — BASIC METABOLIC PANEL
Anion gap: 16 — ABNORMAL HIGH (ref 5–15)
BUN: 51 mg/dL — ABNORMAL HIGH (ref 8–23)
CO2: 22 mmol/L (ref 22–32)
Calcium: 8.3 mg/dL — ABNORMAL LOW (ref 8.9–10.3)
Chloride: 101 mmol/L (ref 98–111)
Creatinine, Ser: 1.57 mg/dL — ABNORMAL HIGH (ref 0.61–1.24)
GFR calc Af Amer: 42 mL/min — ABNORMAL LOW (ref >60)
GFR calc non Af Amer: 37 mL/min — ABNORMAL LOW (ref >60)
Glucose, Bld: 133 mg/dL — ABNORMAL HIGH (ref 70–99)
Potassium: 3.5 mmol/L (ref 3.5–5.1)
Sodium: 139 mmol/L (ref 135–145)

## 2019-05-04 LAB — HEPARIN LEVEL (UNFRACTIONATED): Heparin Unfractionated: 1.32 IU/mL — ABNORMAL HIGH (ref 0.30–0.70)

## 2019-05-04 LAB — URINE CULTURE
Culture: >=100000 — AB
Special Requests: NORMAL

## 2019-05-04 LAB — APTT: aPTT: 73 seconds — ABNORMAL HIGH (ref 24–36)

## 2019-05-04 MED ORDER — HALOPERIDOL LACTATE 5 MG/ML IJ SOLN: 2.0000 mg | Freq: Four times a day (QID) | INTRAMUSCULAR | Status: DC | PRN

## 2019-05-04 MED ORDER — LABETALOL HCL 200 MG PO TABS
100.0000 mg | ORAL_TABLET | Freq: Every morning | ORAL | Status: DC
  Administered 2019-05-04 – 2019-05-06 (×3): 100 mg via ORAL
  Filled 2019-05-04 (×3): qty 1

## 2019-05-04 MED ORDER — APIXABAN 2.5 MG PO TABS
2.5000 mg | ORAL_TABLET | Freq: Two times a day (BID) | ORAL | Status: DC
  Administered 2019-05-04 – 2019-05-06 (×5): 2.5 mg via ORAL
  Filled 2019-05-04 (×5): qty 1

## 2019-05-04 MED ORDER — HYDROXYZINE HCL 25 MG PO TABS
25.0000 mg | ORAL_TABLET | Freq: Every day | ORAL | Status: DC
  Administered 2019-05-04 – 2019-05-05 (×2): 25 mg via ORAL
  Filled 2019-05-04 (×2): qty 1

## 2019-05-04 NOTE — Progress Notes (Signed)
ANTICOAGULATION CONSULT NOTE - follow up Consult  Pharmacy Consult for heparin--> eliquis Indication: atrial fibrillation  Allergies  Allergen Reactions  . Amlodipine   . Lisinopril     Patient Measurements: Height: 6' (182.9 cm) Weight: 192 lb 0.3 oz (87.1 kg) IBW/kg (Calculated) : 77.6 Heparin Dosing Weight: 85 kg  Vital Signs: Temp: 98.2 F (36.8 C) (07/09 0456) Temp Source: Oral (07/09 0456) BP: 155/74 (07/09 0456) Pulse Rate: 61 (07/09 0456)  Labs: Recent Labs    05/01/19 1700 05/02/19 0506  05/03/19 0436 05/03/19 1536 05/04/19 0552  HGB 10.5* 9.9*  --  10.0*  --  10.0*  HCT 32.0* 29.8*  --  30.3*  --  30.5*  PLT 141* 148*  --  135*  --  146*  APTT 47*  --    < > 53* 71* 73*  LABPROT 23.7*  --   --   --   --   --   INR 2.2*  --   --   --   --   --   HEPARINUNFRC  --   --    < > 1.78* 1.58* 1.32*  CREATININE 2.18* 2.10*  --  1.86*  --  1.57*  CKTOTAL  --  75  --   --   --   --    < > = values in this interval not displayed.    Estimated Creatinine Clearance: 30.2 mL/min (A) (by C-G formula based on SCr of 1.57 mg/dL (H)).   Medical History: Past Medical History:  Diagnosis Date  . Bradycardia    s/p SJM PPM by Dr Johney FrameAllred  . CHF (congestive heart failure) (HCC)   . Hyperlipidemia   . Hypertension   . Peripheral neuropathy   . Permanent atrial fibrillation   . Subdural hematoma (HCC)     Medications:  Medications Prior to Admission  Medication Sig Dispense Refill Last Dose  . diphenhydrAMINE (ALLERGY MEDICATION) 25 mg capsule Take 25 mg by mouth daily. OTC allergy med   05/01/2019 at Unknown time  . ELIQUIS 2.5 MG TABS tablet TAKE 1 TABLET BY MOUTH TWICE DAILY (Patient taking differently: Take 2.5 mg by mouth 2 (two) times daily. ) 60 tablet 6 05/01/2019 at 830  . furosemide (LASIX) 40 MG tablet Take 40 mg by mouth 2 (two) times daily.    05/01/2019 at Unknown time  . gabapentin (NEURONTIN) 300 MG capsule Take 900 mg by mouth at bedtime.    Past Week at  Unknown time  . hydrOXYzine (ATARAX/VISTARIL) 25 MG tablet Take 25 mg by mouth at bedtime. For itching   Past Week at Unknown time  . labetalol (NORMODYNE) 200 MG tablet Take 100-200 mg by mouth every morning.    Past Week at Unknown time  . losartan (COZAAR) 50 MG tablet Take 1 tablet (50 mg total) by mouth daily. (Patient taking differently: Take 100 mg by mouth daily. ) 30 tablet 6 05/01/2019 at Unknown time  . oxyCODONE-acetaminophen (PERCOCET/ROXICET) 5-325 MG tablet Take 0.5-1 tablets by mouth daily as needed for moderate pain or severe pain.   04/30/2019 at Unknown time  . potassium chloride SA (K-DUR) 20 MEQ tablet Take 1 tablet (20 mEq total) by mouth daily as needed (taking 1/2 most days). Taking 1/2 tab most days (Patient taking differently: Take 20 mEq by mouth daily. Taking 1/2 tab most days) 30 tablet 2 05/01/2019 at Unknown time  . Tamsulosin HCl (FLOMAX) 0.4 MG CAPS Take 0.4 mg by mouth daily.  05/01/2019 at Unknown time  . tobramycin (TOBREX) 0.3 % ophthalmic solution Place 2 drops into the left eye 4 (four) times daily.   05/01/2019 at 830    Assessment: Pharmacy consulted to dose heparin in patient with atrial fibrillation.  Patient was on apixaban prior to admission with last dose given 7/6 0830. Patient is now tolerating po's, MD requested restart eliquis 85 yo Scr 1.57  Goal of Therapy:  Monitor platelets by anticoagulation protocol: Yes   Plan:  D/c heparin Eliquis 2.5mg  po BID Continue to monitor H&H and platelets/ S/s of bleeding  Isac Sarna, BS [place]ena [place], BCPS Clinical Pharmacist Pager [phone] 05/04/2019,11:12 AM

## 2019-05-04 NOTE — Progress Notes (Signed)
ANTICOAGULATION CONSULT NOTE - follow up Consult  Pharmacy Consult for heparin Indication: atrial fibrillation  Allergies  Allergen Reactions  . Amlodipine   . Lisinopril     Patient Measurements: Height: 6' (182.9 cm) Weight: 192 lb 0.3 oz (87.1 kg) IBW/kg (Calculated) : 77.6 Heparin Dosing Weight: 85 kg  Vital Signs: Temp: 98.2 F (36.8 C) (07/09 0456) Temp Source: Oral (07/09 0456) BP: 155/74 (07/09 0456) Pulse Rate: 61 (07/09 0456)  Labs: Recent Labs    05/01/19 1700 05/02/19 0506  05/03/19 0436 05/03/19 1536 05/04/19 0552  HGB 10.5* 9.9*  --  10.0*  --  10.0*  HCT 32.0* 29.8*  --  30.3*  --  30.5*  PLT 141* 148*  --  135*  --  146*  APTT 47*  --    < > 53* 71* 73*  LABPROT 23.7*  --   --   --   --   --   INR 2.2*  --   --   --   --   --   HEPARINUNFRC  --   --    < > 1.78* 1.58* 1.32*  CREATININE 2.18* 2.10*  --  1.86*  --  1.57*  CKTOTAL  --  75  --   --   --   --    < > = values in this interval not displayed.    Estimated Creatinine Clearance: 30.2 mL/min (A) (by C-G formula based on SCr of 1.57 mg/dL (H)).   Medical History: Past Medical History:  Diagnosis Date  . Bradycardia    s/p SJM PPM by Dr Johney FrameAllred  . CHF (congestive heart failure) (HCC)   . Hyperlipidemia   . Hypertension   . Peripheral neuropathy   . Permanent atrial fibrillation   . Subdural hematoma (HCC)     Medications:  Medications Prior to Admission  Medication Sig Dispense Refill Last Dose  . diphenhydrAMINE (ALLERGY MEDICATION) 25 mg capsule Take 25 mg by mouth daily. OTC allergy med   05/01/2019 at Unknown time  . ELIQUIS 2.5 MG TABS tablet TAKE 1 TABLET BY MOUTH TWICE DAILY (Patient taking differently: Take 2.5 mg by mouth 2 (two) times daily. ) 60 tablet 6 05/01/2019 at 830  . furosemide (LASIX) 40 MG tablet Take 40 mg by mouth 2 (two) times daily.    05/01/2019 at Unknown time  . gabapentin (NEURONTIN) 300 MG capsule Take 900 mg by mouth at bedtime.    Past Week at Unknown time   . hydrOXYzine (ATARAX/VISTARIL) 25 MG tablet Take 25 mg by mouth at bedtime. For itching   Past Week at Unknown time  . labetalol (NORMODYNE) 200 MG tablet Take 100-200 mg by mouth every morning.    Past Week at Unknown time  . losartan (COZAAR) 50 MG tablet Take 1 tablet (50 mg total) by mouth daily. (Patient taking differently: Take 100 mg by mouth daily. ) 30 tablet 6 05/01/2019 at Unknown time  . oxyCODONE-acetaminophen (PERCOCET/ROXICET) 5-325 MG tablet Take 0.5-1 tablets by mouth daily as needed for moderate pain or severe pain.   04/30/2019 at Unknown time  . potassium chloride SA (K-DUR) 20 MEQ tablet Take 1 tablet (20 mEq total) by mouth daily as needed (taking 1/2 most days). Taking 1/2 tab most days (Patient taking differently: Take 20 mEq by mouth daily. Taking 1/2 tab most days) 30 tablet 2 05/01/2019 at Unknown time  . Tamsulosin HCl (FLOMAX) 0.4 MG CAPS Take 0.4 mg by mouth daily.  05/01/2019 at Unknown time  . tobramycin (TOBREX) 0.3 % ophthalmic solution Place 2 drops into the left eye 4 (four) times daily.   05/01/2019 at 830    Assessment: Pharmacy consulted to dose heparin in patient with atrial fibrillation.  Patient was on apixaban prior to admission with last dose given 7/6 0830. Will need to monitor based on aPTT until correlation with heparin levels.. APTT  remains therapeutic at 73. APTT still not correlating with heparin level  Goal of Therapy:  APTT 66-102 seconds Heparin level 0.3-0.7 units/ml Monitor platelets by anticoagulation protocol: Yes   Plan:  Continue Heparin infusion at 900 units/hr Check APTT and anti-Xa level daily while on heparin Continue to monitor H&H and platelets  Isac Sarna, BS [place], BCPS Clinical Pharmacist Pager [phone] 05/04/2019,7:43 AM

## 2019-05-04 NOTE — Progress Notes (Signed)
  Speech Language Pathology Treatment: Dysphagia  Patient Details Name: Edwin [name] MRN: [id number] DOB: 1921/03/29 Today's Date: 05/04/2019 Time: 0938-1829 SLP Time Calculation (min) (ACUTE ONLY): 18 min  Assessment / Plan / Recommendation Clinical Impression  Skilled treatment with mod multimodal cues provided d/t decreased cognitive status with pt noted to have continued confusion/disoriented to place/time; nursing in room during session providing medications to pt utilizing thin/whole pills; pt exhibited delayed throat clearing with pill administration; ST recommending oral medications given with cohesive bolus (ie: pudding, yogurt) to promote pharyngeal clearance; liquid wash, slow rate and small bites/sips completed during PO administration of puree/thin via straw without overt s/s of aspiration noted throughout session; pt edentulous and soft solids not attempted d/t pt decreased cognitive status/refusal.  Continue Dysphagia 1 (puree)/thin liquids via small sips (straw/cup) with full supervision warranted, but pt allowed to release restraints during meals to promote increased PO intake; medications given one at a time with cohesive bolus; pt should be alert during all POs to decrease/eliminate aspiration risk d/t cognitive decline; ST will continue efforts during acute stay.  HPI HPI: Edwin [name] a 85 y.o.malewith medical history significant ofbradycardia, status postSJM/PPMby Dr. Rayann Heman, permanent atrial fibrillation, history of subdural hematoma, unspecified CHF, hyperlipidemia, hypertension, peripheral neuropathy who has been treated recently for UTI with oral antibiotics and is coming to the emergency department due to [place]. He is unable to provide further information. I spoke to the patient's son, who stated that he did not have further information as well.He mentions speaking to the patient's primary caregiver, but was unable to obtain sufficient information. Patient was  admitted with sepsis secondary to UTI and started on Rocephin empirically with urine cultures pending. BSE requested.      SLP Plan  Continue with current plan of care       Recommendations  Diet recommendations: Dysphagia 1 (puree);Thin liquid Liquids provided via: Cup;Straw Medication Administration: Whole meds with puree Supervision: Staff to assist with self feeding;Full supervision/cueing for compensatory strategies Compensations: Slow rate;Small sips/bites Postural Changes and/or Swallow Maneuvers: Seated upright 90 degrees;Upright 30-60 min after meal                Oral Care Recommendations: Oral care BID;Staff/trained caregiver to provide oral care Follow up Recommendations: 24 hour supervision/assistance SLP Visit Diagnosis: Dysphagia, unspecified (R13.10) Plan: Continue with current plan of care                       Elvina Sidle, M.S., CCC-SLP 05/04/2019, 1:03 PM

## 2019-05-04 NOTE — Progress Notes (Signed)
PROGRESS NOTE    Edwin [name]  ZOX:[id number] DOB: 01-22-1921 DOA: 05/01/2019 PCP: Kirstie PeriShah, Ashish, MD   Brief Narrative:  Per HPI: Edwin SoxMitchell B Wilsonis a 85 y.o.malewith medical history significant ofbradycardia, status postSJM/PPMby Dr. Johney FrameAllred, permanent atrial fibrillation, history of subdural hematoma, unspecified CHF, hyperlipidemia, hypertension, peripheral neuropathy who has been treated recently for UTI with oral antibiotics and is coming to the emergency department due to AMS. He is unable to provide further information. I spoke to the patient's son, who stated that he did not have further information as well.He mentions speaking to the patient's primary caregiver, but was unable to obtain sufficient information.  Patient was admitted with sepsis secondary to UTI and started on Rocephin.  Urine cultures have resulted with E. coli and 7/9 which are sensitive to Rocephin.  Patient continues to have some improvement in his alertness this morning, but was restrained overnight due to agitation.   Assessment & Plan:   Principal Problem:   Sepsis secondary to UTI [facility](HCC) Active Problems:   Hyperlipidemia   Essential hypertension   ATRIAL FIBRILLATION   BRADYCARDIA-TACHYCARDIA SYNDROME   Reducible right inguinal hernia   Acute on chronic metabolic encephalopathy sepsis secondary to E. coli UTI (HCC)/appears to have dementia Okay for transfer to telemetry today Continue ceftriaxone 1 g IVPB every 24 hours. Monitor CBC and BMP Urine with E. coli noted PT evaluation with need for SNF noted Currently on dysphagia 2 diet patient will wait TSH 0.89 and ammonia 22 No repeat head CT indicated at this time Patient cannot have MRI due to pacemaker  Active Problems: Essential hypertension Resume home labetalol  ATRIAL FIBRILLATION CHA?DS?-VASc Scoreof at least 5. Resume home labetalol and discontinue heparin drip and start back Eliquis Resume once more alert.   BRADYCARDIA-TACHYCARDIA SYNDROME Status post pacemaker placement. Resume home labetalol  Hyperlipidemia Currently not on medical therapy. CK 75  History of unspecified CHF Discontinue IV fluid for now Currently appears euvolemic  Left forearm skin tear Wound care  AKI vs CKD stage III/IV Hold IV fluid for now to avoid volume overload Appears to be having good urine output with 700 cc noted overnight Monitor repeat BMP Avoid nephrotoxic agents   DVT prophylaxis:Eliquis Code Status:Full Family Communication:Son, Edwin LericheMark will be updated at [phone] Disposition Plan:Continue IV antibiotic treatment and transfer to telemetry and continue current treatment.  Anticipate transfer to SNF once eating and more awake/alert.    Continue IV Rocephin for now as well as heparin drip until it is known that patient is tolerating more oral intake.  We will try to remove restraints as well as Foley catheter and telemetry leads and use telemetry sitter.   Consultants:  None  Procedures:  None  Antimicrobials:  Rocephin 7/6->  Subjective: Patient seen and evaluated today with increased alertness noted this morning.  He continues to have some persistent mild hematuria noted.  He was restrained overnight due to agitation.  Objective: Vitals:   05/03/19 1936 05/03/19 2106 05/04/19 0456 05/04/19 0826  BP:  (!) 153/80 (!) 155/74   Pulse:  65 61   Resp:  20 18   Temp:  97.7 F (36.5 C) 98.2 F (36.8 C)   TempSrc:  Oral Oral   SpO2: 97% 96% 96% 95%  Weight:      Height:        Intake/Output Summary (Last 24 hours) at 05/04/2019 1054 Last data filed at 05/04/2019 0900 Gross per 24 hour  Intake 2420.24 ml  Output 700 ml  Net  1720.24 ml   Filed Weights   05/01/19 2100 05/02/19 0500 05/03/19 0500  Weight: 87.2 kg 87.2 kg 87.1 kg    Examination:  General exam: Appears calm and comfortable, currently in restraints Respiratory system: Clear to auscultation.  Respiratory effort normal. Cardiovascular system: S1 & S2 heard, RRR. No JVD, murmurs, rubs, gallops or clicks. No pedal edema. Gastrointestinal system: Abdomen is nondistended, soft and nontender. No organomegaly or masses felt. Normal bowel sounds heard. Central nervous system: Alert and awake.  Appears more responsive. Extremities: Symmetric 5 x 5 power. Skin: No rashes, lesions or ulcers Psychiatry: Difficult to assess.    Data Reviewed: I have personally reviewed following labs and imaging studies  CBC: Recent Labs  Lab 05/01/19 1700 05/02/19 0506 05/03/19 0436 05/04/19 0552  WBC 11.8* 12.8* 11.4* 14.3*  NEUTROABS 10.4* 10.8* 9.0* 10.8*  HGB 10.5* 9.9* 10.0* 10.0*  HCT 32.0* 29.8* 30.3* 30.5*  MCV 101.3* 100.3* 99.7 101.0*  PLT 141* 148* 135* 146*   Basic Metabolic Panel: Recent Labs  Lab 05/01/19 1700 05/02/19 0506 05/03/19 0436 05/04/19 0552  NA 130* 131* 135 139  K 4.5 4.0 3.8 3.5  CL 94* 97* 103 101  CO2 25 21* 21* 22  GLUCOSE 112* 100* 110* 133*  BUN 57* 57* 56* 51*  CREATININE 2.18* 2.10* 1.86* 1.57*  CALCIUM 8.3* 8.2* 8.0* 8.3*   GFR: Estimated Creatinine Clearance: 30.2 mL/min (A) (by C-G formula based on SCr of 1.57 mg/dL (H)). Liver Function Tests: Recent Labs  Lab 05/01/19 1700 05/02/19 0506 05/03/19 0436  AST 43* 38 31  ALT 33 28 27  ALKPHOS 142* 145* 160*  BILITOT 2.0* 1.9* 1.4*  PROT 6.0* 5.7* 5.6*  ALBUMIN 3.1* 2.9* 2.7*   No results for input(s): LIPASE, AMYLASE in the last 168 hours. Recent Labs  Lab 05/03/19 0721  AMMONIA 22   Coagulation Profile: Recent Labs  Lab 05/01/19 1700  INR 2.2*   Cardiac Enzymes: Recent Labs  Lab 05/02/19 0506  CKTOTAL 75   BNP (last 3 results) No results for input(s): PROBNP in the last 8760 hours. HbA1C: No results for input(s): HGBA1C in the last 72 hours. CBG: No results for input(s): GLUCAP in the last 168 hours. Lipid Profile: No results for input(s): CHOL, HDL, LDLCALC, TRIG,  CHOLHDL, LDLDIRECT in the last 72 hours. Thyroid Function Tests: Recent Labs    05/03/19 0721  TSH 0.898   Anemia Panel: No results for input(s): VITAMINB12, FOLATE, FERRITIN, TIBC, IRON, RETICCTPCT in the last 72 hours. Sepsis Labs: Recent Labs  Lab 05/01/19 1700 05/03/19 0436  LATICACIDVEN 1.6 0.9    Recent Results (from the past 240 hour(s))  Urine Culture     Status: Abnormal   Collection Time: 05/01/19  4:38 PM   Specimen: Urine, Catheterized  Result Value Ref Range Status   Specimen Description   Final    URINE, CATHETERIZED Performed at [facility], [address] [place], [place] [address]    Special Requests   Final    Normal Performed at [facility], [address] [place], [place] [address]    Culture >=100,000 COLONIES/mL ESCHERICHIA COLI (A)  Final   Report Status 05/04/2019 FINAL  Final   Organism ID, Bacteria ESCHERICHIA COLI (A)  Final      Susceptibility   Escherichia coli - MIC*    AMPICILLIN >=32 RESISTANT Resistant     CEFAZOLIN <=4 SENSITIVE Sensitive     CEFTRIAXONE <=1 SENSITIVE Sensitive     CIPROFLOXACIN >=4  RESISTANT Resistant     GENTAMICIN <=1 SENSITIVE Sensitive     IMIPENEM <=0.25 SENSITIVE Sensitive     NITROFURANTOIN <=16 SENSITIVE Sensitive     TRIMETH/SULFA <=20 SENSITIVE Sensitive     AMPICILLIN/SULBACTAM 16 INTERMEDIATE Intermediate     PIP/TAZO <=4 SENSITIVE Sensitive     Extended ESBL NEGATIVE Sensitive     * >=100,000 COLONIES/mL ESCHERICHIA COLI  SARS Coronavirus 2 (CEPHEID - Performed in [place] hospital lab), Hosp Order     Status: None   Collection Time: 05/01/19  6:39 PM   Specimen: Nasopharyngeal Swab  Result Value Ref Range Status   SARS Coronavirus 2 NEGATIVE NEGATIVE Final    Comment: (NOTE) If result is NEGATIVE SARS-CoV-2 target nucleic acids are NOT DETECTED. The SARS-CoV-2 RNA is generally detectable in upper and lower  respiratory specimens during the acute phase of infection. The lowest  concentration  of SARS-CoV-2 viral copies this assay can detect is 250  copies / mL. A negative result does not preclude SARS-CoV-2 infection  and should not be used as the sole basis for treatment or other  patient management decisions.  A negative result may occur with  improper specimen collection / handling, submission of specimen other  than nasopharyngeal swab, presence of viral mutation(s) within the  areas targeted by this assay, and inadequate number of viral copies  (<250 copies / mL). A negative result must be combined with clinical  observations, patient history, and epidemiological information. If result is POSITIVE SARS-CoV-2 target nucleic acids are DETECTED. The SARS-CoV-2 RNA is generally detectable in upper and lower  respiratory specimens dur ing the acute phase of infection.  Positive  results are indicative of active infection with SARS-CoV-2.  Clinical  correlation with patient history and other diagnostic information is  necessary to determine patient infection status.  Positive results do  not rule out bacterial infection or co-infection with other viruses. If result is PRESUMPTIVE POSTIVE SARS-CoV-2 nucleic acids MAY BE PRESENT.   A presumptive positive result was obtained on the submitted specimen  and confirmed on repeat testing.  While 2019 novel coronavirus  (SARS-CoV-2) nucleic acids may be present in the submitted sample  additional confirmatory testing may be necessary for epidemiological  and / or clinical management purposes  to differentiate between  SARS-CoV-2 and other Sarbecovirus currently known to infect humans.  If clinically indicated additional testing with an alternate test  methodology [phone]) is advised. The SARS-CoV-2 RNA is generally  detectable in upper and lower respiratory sp ecimens during the acute  phase of infection. The expected result is Negative. Fact Sheet for Patients:  StrictlyIdeas.no Fact Sheet for Healthcare  Providers: BankingDealers.co.za This test is not yet approved or cleared by the [place] FDA and has been authorized for detection and/or diagnosis of SARS-CoV-2 by FDA under an Emergency Use Authorization (EUA).  This EUA will remain in effect (meaning this test can be used) for the duration of the COVID-19 declaration under Section 564(b)(1) of the Act, 21 U.S.C. section 360bbb-3(b)(1), unless the authorization is terminated or revoked sooner. Performed at [facility], [address], [place], [place] [address]   MRSA PCR Screening     Status: None   Collection Time: 05/01/19  8:55 PM   Specimen: Nasal Mucosa; Nasopharyngeal  Result Value Ref Range Status   MRSA by PCR NEGATIVE NEGATIVE Final    Comment:        The GeneXpert MRSA Assay (FDA approved for NASAL specimens only), is one component  of a comprehensive MRSA colonization surveillance program. It is not intended to diagnose MRSA infection nor to guide or monitor treatment for MRSA infections. Performed at [facility], [address] [place], [place] [address]          Radiology Studies: No results found.      Scheduled Meds: . feeding supplement (ENSURE ENLIVE)  237 mL Oral TID BM  . mouth rinse  15 mL Mouth Rinse q12n4p  . tamsulosin  0.4 mg Oral Daily  . tobramycin  2 drop Left Eye QID   Continuous Infusions: . cefTRIAXone (ROCEPHIN)  IV Stopped (05/03/19 2029)  . heparin 900 Units/hr (05/04/19 0400)  . lactated ringers 100 mL/hr at 05/04/19 0453     LOS: 3 days    Time spent: 30 minutes    Pratik Hoover BrunetteD Shah, DO [facility] Pager [phone]  If 7PM-7AM, please contact night-coverage [email or web address] Password [email or web address] 05/04/2019, 10:54 AM

## 2019-05-04 NOTE — Progress Notes (Signed)
Physical Therapy Treatment Patient Details Name: Edwin [name] MRN: [id number] DOB: 11/03/1920 Today's Date: 05/04/2019    History of Present Illness Edwin [name] is a 85 y.o. male with medical history significant of bradycardia, status post SJM/PPM by Dr. Johney FrameAllred, permanent atrial fibrillation, history of subdural hematoma, unspecified CHF, hyperlipidemia, hypertension, peripheral neuropathy who has been treated recently for UTI with oral antibiotics and is coming to the emergency department due to [place].  He is unable to provide further information.  I spoke to the patient's son, who stated that he did not have further information as well.  He mentions speaking to the patient's primary caregiver, but was unable to obtain sufficient information.    PT Comments    Patient more alert and cooperative today, able to follow directions 60-70% of time with repeated verbal/tactile cuing, completed LAQ and marching in place while seated at bedside and ambulated up to doorway with slow labored cadence with SpO2 at 93% while on room air.  Patient put back to bed with sitter in room after therapy.  Patient will benefit from continued physical therapy in hospital and recommended venue below to increase strength, balance, endurance for safe ADLs and gait.    Follow Up Recommendations  SNF;Supervision for mobility/OOB;Supervision/Assistance - 24 hour     Equipment Recommendations  None recommended by PT    Recommendations for Other Services       Precautions / Restrictions Precautions Precautions: Fall Restrictions Weight Bearing Restrictions: No    Mobility  Bed Mobility Overal bed mobility: Needs Assistance Bed Mobility: Supine to Sit;Sit to Supine     Supine to sit: Min assist;Mod assist Sit to supine: Min assist;Mod assist   General bed mobility comments: labored movement, increased time  Transfers Overall transfer level: Needs assistance Equipment used: Rolling walker (2  wheeled) Transfers: Sit to/from [employer] CorporationStand;Stand Pivot Transfers Sit to Stand: Mod assist Stand pivot transfers: Mod assist;Min assist       General transfer comment: slow labored movement  Ambulation/Gait Ambulation/Gait assistance: Mod assist Gait Distance (Feet): 20 Feet Assistive device: Rolling walker (2 wheeled) Gait Pattern/deviations: Decreased step length - right;Decreased step length - left;Decreased stride length Gait velocity: decreased   General Gait Details: slow labored cadence with Max verbal/tactile cueing to follow directions, no loss of balance   Stairs             Wheelchair Mobility    Modified Rankin (Stroke Patients Only)       Balance Overall balance assessment: Needs assistance Sitting-balance support: Feet supported;No upper extremity supported Sitting balance-Leahy Scale: Fair Sitting balance - Comments: seated at bedside   Standing balance support: During functional activity;Bilateral upper extremity supported Standing balance-Leahy Scale: Fair Standing balance comment: using RW                            Cognition Arousal/Alertness: Awake/alert Behavior During Therapy: Restless;Flat affect Overall Cognitive Status: No family/caregiver present to determine baseline cognitive functioning                                 General Comments: patient more cooperative      Exercises General Exercises - Lower Extremity Long Arc Quad: Seated;AROM;Both;10 reps Hip Flexion/Marching: Seated;AROM;Both;10 reps    General Comments        Pertinent Vitals/Pain Pain Assessment: Faces Faces Pain Scale: No hurt    Home Living  Prior Function            PT Goals (current goals can now be found in the care plan section) Acute Rehab PT Goals Patient Stated Goal: not stated PT Goal Formulation: With patient Time For Goal Achievement: 05/16/19 Potential to Achieve Goals: Good Progress  towards PT goals: Progressing toward goals    Frequency    Min 3X/week      PT Plan Current plan remains appropriate    Co-evaluation              AM-PAC PT "6 Clicks" Mobility   Outcome Measure  Help needed turning from your back to your side while in a flat bed without using bedrails?: A Little Help needed moving from lying on your back to sitting on the side of a flat bed without using bedrails?: A Little Help needed moving to and from a bed to a chair (including a wheelchair)?: A Lot Help needed standing up from a chair using your arms (e.g., wheelchair or bedside chair)?: A Lot Help needed to walk in hospital room?: A Lot Help needed climbing 3-5 steps with a railing? : Total 6 Click Score: 13    End of Session Equipment Utilized During Treatment: Gait belt Activity Tolerance: Patient tolerated treatment well Patient left: in bed;with call bell/phone within reach;with bed alarm set;with nursing/sitter in room Nurse Communication: Mobility status PT Visit Diagnosis: Unsteadiness on feet (R26.81);Other abnormalities of gait and mobility (R26.89);Muscle weakness (generalized) (M62.81)     Time: 7782-4235 PT Time Calculation (min) (ACUTE ONLY): 32 min  Charges:  $Therapeutic Exercise: 8-22 mins $Therapeutic Activity: 8-22 mins                     3:52 PM, 05/04/19 Lonell Grandchild, MPT Physical Therapist with [facility] [phone] office [phone] mobile phone

## 2019-05-05 ENCOUNTER — Encounter: Payer: Medicare Other | Admitting: Internal Medicine

## 2019-05-05 LAB — BASIC METABOLIC PANEL
Anion gap: 13 (ref 5–15)
BUN: 51 mg/dL — ABNORMAL HIGH (ref 8–23)
CO2: 22 mmol/L (ref 22–32)
Calcium: 8.5 mg/dL — ABNORMAL LOW (ref 8.9–10.3)
Chloride: 107 mmol/L (ref 98–111)
Creatinine, Ser: 1.43 mg/dL — ABNORMAL HIGH (ref 0.61–1.24)
GFR calc Af Amer: 48 mL/min — ABNORMAL LOW (ref >60)
GFR calc non Af Amer: 41 mL/min — ABNORMAL LOW (ref >60)
Glucose, Bld: 148 mg/dL — ABNORMAL HIGH (ref 70–99)
Potassium: 3.6 mmol/L (ref 3.5–5.1)
Sodium: 142 mmol/L (ref 135–145)

## 2019-05-05 LAB — CBC
HCT: 32.6 % — ABNORMAL LOW (ref 39.0–52.0)
Hemoglobin: 10.6 g/dL — ABNORMAL LOW (ref 13.0–17.0)
MCH: 32.8 pg (ref 26.0–34.0)
MCHC: 32.5 g/dL (ref 30.0–36.0)
MCV: 100.9 fL — ABNORMAL HIGH (ref 80.0–100.0)
Platelets: 160 10*3/uL (ref 150–400)
RBC: 3.23 MIL/uL — ABNORMAL LOW (ref 4.22–5.81)
RDW: 14.1 % (ref 11.5–15.5)
WBC: 14.9 10*3/uL — ABNORMAL HIGH (ref 4.0–10.5)
nRBC: 0 % (ref 0.0–0.2)

## 2019-05-05 LAB — NOVEL CORONAVIRUS, NAA (HOSP ORDER, SEND-OUT TO REF LAB; TAT 18-24 HRS): SARS-CoV-2, NAA: NOT DETECTED

## 2019-05-05 MED ORDER — CEFDINIR 300 MG PO CAPS: 300.0000 mg | ORAL_CAPSULE | Freq: Two times a day (BID) | ORAL | 0 refills | Status: AC

## 2019-05-05 MED ORDER — ENSURE ENLIVE PO LIQD: 237.0000 mL | Freq: Three times a day (TID) | ORAL | 12 refills | Status: AC

## 2019-05-05 NOTE — TOC Transition Note (Signed)
Transition of Care [facility]) - CM/SW Discharge Note   Patient Details  Name: Edwin [name] MRN: [id number] Date of Birth: August 07, 1921  [facility] CM/SW Contact:  Ihor Gully, LCSW Phone Number: 05/05/2019, 5:26 PM   Clinical Narrative:    Patient's son, Jenny Reichmann, has spoken with family and they want patient to go home. Requested hospital bed and wheelchair. DME providers discussed and referral made.  RN and attending notified of discharge plan change.  Patient expected to discharge on 05/06/2019 once DME has been delivered to the home. Son aware that DME will be delivered tomorrow.     Barriers to Discharge: No Barriers Identified   Patient Goals and CMS Choice Patient states their goals for this hospitalization and ongoing recovery are:: to get back to baseline CMS Medicare.gov Compare Post Acute Care list provided to:: Patient Represenative (must comment)(son, Randa Ngo)    Discharge Placement PASRR number recieved: 05/02/19                     Discharge Plan and Services In-house Referral: Clinical Social Work   Post Acute Care Choice: [place]                               Social Determinants of Health (SDOH) Interventions     Readmission Risk Interventions No flowsheet data found.

## 2019-05-05 NOTE — Progress Notes (Signed)
Physical Therapy Treatment Patient Details Name: Edwin [name] MRN: [id number] DOB: 1920-11-19 Today's Date: 05/05/2019    History of Present Illness Edwin [name] is a 85 y.o. male with medical history significant of bradycardia, status post SJM/PPM by Dr. Rayann [name], permanent atrial fibrillation, history of subdural hematoma, unspecified CHF, hyperlipidemia, hypertension, peripheral neuropathy who has been treated recently for UTI with oral antibiotics and is coming to the emergency department due to [place].  He is unable to provide further information.  I spoke to the patient's son, who stated that he did not have further information as well.  He mentions speaking to the patient's primary caregiver, but was unable to obtain sufficient information.    PT Comments    Patient presents slightly lethargic and able to participate with therapy requiring constant verbal/tactile cueing.  Patient tolerated sitting up at bedside for up to 20 minutes while assisted with completing BLE ROM/strengthening exercises, required active assistance due to fair/poor carryover for following instructions today.  Patient limited to a few steps at bedside due to fatigue and lethargy and tolerated sitting up in chair with mittens on hands after therapy - RN/NT notified.  Patient will benefit from continued physical therapy in hospital and recommended venue below to increase strength, balance, endurance for safe ADLs and gait.    Follow Up Recommendations  SNF;Supervision for mobility/OOB;Supervision/Assistance - 24 hour     Equipment Recommendations  None recommended by PT    Recommendations for Other Services       Precautions / Restrictions Precautions Precautions: Fall Restrictions Weight Bearing Restrictions: No    Mobility  Bed Mobility Overal bed mobility: Needs Assistance Bed Mobility: Supine to Sit     Supine to sit: Mod assist     General bed mobility comments: labored movement, increased  time  Transfers Overall transfer level: Needs assistance Equipment used: Rolling walker (2 wheeled) Transfers: Sit to/from [facility] Sit to Stand: Mod assist Stand pivot transfers: Mod assist       General transfer comment: had difficulty with sit to stands due to BLE weakness  Ambulation/Gait Ambulation/Gait assistance: Mod assist Gait Distance (Feet): 5 Feet Assistive device: Rolling walker (2 wheeled) Gait Pattern/deviations: Decreased step length - right;Decreased step length - left;Decreased stride length Gait velocity: slow   General Gait Details: limited to 5-6 slow labored unsteady steps at bedside due to c/o fatigue and lethargy   Stairs             Wheelchair Mobility    Modified Rankin (Stroke Patients Only)       Balance Overall balance assessment: Needs assistance Sitting-balance support: Feet supported;No upper extremity supported Sitting balance-Leahy Scale: Fair Sitting balance - Comments: seated at bedside   Standing balance support: During functional activity;Bilateral upper extremity supported Standing balance-Leahy Scale: Poor Standing balance comment: fair/poor using RW                            Cognition Arousal/Alertness: Awake/alert;Lethargic Behavior During Therapy: Flat affect Overall Cognitive Status: No family/caregiver present to determine baseline cognitive functioning                                 General Comments: Patient slightly lethargic      Exercises General Exercises - Lower Extremity Long Arc Quad: Seated;AROM;Both;10 reps;AAROM Hip Flexion/Marching: Seated;AROM;Both;10 reps;AAROM    General Comments  Pertinent Vitals/Pain Pain Assessment: Faces Faces Pain Scale: No hurt    Home Living                      Prior Function            PT Goals (current goals can now be found in the care plan section) Acute Rehab PT Goals Patient Stated Goal:  not stated PT Goal Formulation: With patient Time For Goal Achievement: 05/16/19 Potential to Achieve Goals: Good Progress towards PT goals: Progressing toward goals    Frequency    Min 3X/week      PT Plan Current plan remains appropriate    Co-evaluation              AM-PAC PT "6 Clicks" Mobility   Outcome Measure  Help needed turning from your back to your side while in a flat bed without using bedrails?: A Little Help needed moving from lying on your back to sitting on the side of a flat bed without using bedrails?: A Lot Help needed moving to and from a bed to a chair (including a wheelchair)?: A Lot Help needed standing up from a chair using your arms (e.g., wheelchair or bedside chair)?: A Lot Help needed to walk in hospital room?: A Lot Help needed climbing 3-5 steps with a railing? : Total 6 Click Score: 12    End of Session Equipment Utilized During Treatment: Gait belt Activity Tolerance: Patient tolerated treatment well;Patient limited by fatigue;Patient limited by lethargy Patient left: in chair;with call bell/phone within reach;with chair alarm set Nurse Communication: Mobility status PT Visit Diagnosis: Unsteadiness on feet (R26.81);Other abnormalities of gait and mobility (R26.89);Muscle weakness (generalized) (M62.81)     Time: [id number] PT Time Calculation (min) (ACUTE ONLY): 34 min  Charges:  $Therapeutic Exercise: 8-22 mins $Therapeutic Activity: 8-22 mins                     8:59 AM, 05/05/19 Edwin [name], MPT Physical Therapist [facility] 336 [phone] office [phone] mobile phone

## 2019-05-05 NOTE — Progress Notes (Addendum)
Wheelchair: Patient suffers from weakness which impairs their ability to perform daily activities like ambulating in the home. A walker will not resolve issue with performing activities of daily living. A wheelchair will allow patient to safely perform daily activities. Patient can safely propel the wheelchair in the home or has a caregiver who can provide assistance.    Hospital bed: Patient requires frequent re-positioning of the body in ways that cannot be achieved with an ordinary bed or wedge pillow, to eliminate pain and the head of the bed needs to be elevated more than 30 degrees most of the time.

## 2019-05-05 NOTE — Discharge Summary (Addendum)
Physician Discharge Summary  Edwin [name] UJW:[id number] DOB: 1921/03/18 DOA: 05/01/2019  PCP: Kirstie Peri, MD  Admit date: 05/01/2019  Discharge date: 05/05/2019  Admitted From:Home  Disposition:  Home with home health  Recommendations for Outpatient Follow-up:  1. Follow up with PCP in 1-2 weeks 2. Continue on Omnicef as prescribed for 2 more days to complete treatment for UTI 3. Continue other home medications as noted below  Home Health: Yes, with RN, PT, Aide  Equipment/Devices: Wheelchair, hospital bed  Discharge Condition: Stable  CODE STATUS: DNR  Diet recommendation: Dysphagia 1 diet  Brief/Interim Summary: Per HPI: Edwin Risby Wilsonis a 85 y.o.malewith medical history significant ofbradycardia, status postSJM/PPMby Dr. Johney Frame, permanent atrial fibrillation, history of subdural hematoma, unspecified CHF, hyperlipidemia, hypertension, peripheral neuropathy who has been treated recently for UTI with oral antibiotics and is coming to the emergency department due to AMS. He is unable to provide further information. I spoke to the patient's son, who stated that he did not have further information as well.He mentions speaking to the patient's primary caregiver, but was unable to obtain sufficient information.  Patient was admitted with sepsis secondary to UTI and started on Rocephin.  Urine cultures have resulted with E. coli and 7/9 which are sensitive to Rocephin.  Patient has some brief agitation upon transfer to the floor from ICU and required restraints overnight, but has been without restraints or sitter at bedside over the last 24 hours.  Foley catheter has been removed and patient has been urinating well.  His mentation has improved and appears to be very close to baseline if not already there.  He is tolerating dysphagia 1 diet and taking the rest of his medications as otherwise prescribed.  He will require 2 more days of antibiotics to complete course of  treatment for E. coli UTI.  No other acute events noted throughout the course of this admission.  Discharge Diagnoses:  Principal Problem:   Sepsis secondary to UTI [facility]) Active Problems:   Hyperlipidemia   Essential hypertension   ATRIAL FIBRILLATION   BRADYCARDIA-TACHYCARDIA SYNDROME   Reducible right inguinal hernia  Principal discharge diagnosis: Acute metabolic encephalopathy secondary to sepsis with E. coli UTI.  Discharge Instructions  Discharge Instructions    Diet - low sodium heart healthy   Complete by: As directed    Increase activity slowly   Complete by: As directed      Allergies as of 05/05/2019      Reactions   Amlodipine    Lisinopril       Medication List    STOP taking these medications   losartan 50 MG tablet Commonly known as: COZAAR   oxyCODONE-acetaminophen 5-325 MG tablet Commonly known as: PERCOCET/ROXICET     TAKE these medications   Allergy Medication 25 mg capsule Generic drug: diphenhydrAMINE Take 25 mg by mouth daily. OTC allergy med   cefdinir 300 MG capsule Commonly known as: OMNICEF Take 1 capsule (300 mg total) by mouth 2 (two) times daily for 2 days.   Eliquis 2.5 MG Tabs tablet Generic drug: apixaban TAKE 1 TABLET BY MOUTH TWICE DAILY What changed: how much to take   feeding supplement (ENSURE ENLIVE) Liqd Take 237 mLs by mouth 3 (three) times daily between meals.   furosemide 40 MG tablet Commonly known as: LASIX Take 40 mg by mouth 2 (two) times daily.   gabapentin 300 MG capsule Commonly known as: NEURONTIN Take 900 mg by mouth at bedtime.   hydrOXYzine 25 MG tablet Commonly  known as: ATARAX/VISTARIL Take 25 mg by mouth at bedtime. For itching   labetalol 200 MG tablet Commonly known as: NORMODYNE Take 100-200 mg by mouth every morning.   potassium chloride SA 20 MEQ tablet Commonly known as: K-DUR Take 1 tablet (20 mEq total) by mouth daily as needed (taking 1/2 most days). Taking 1/2 tab most days What  changed: when to take this   tamsulosin 0.4 MG Caps capsule Commonly known as: FLOMAX Take 0.4 mg by mouth daily.   tobramycin 0.3 % ophthalmic solution Commonly known as: TOBREX Place 2 drops into the left eye 4 (four) times daily.       Contact information for follow-up providers    Kirstie Peri, MD Follow up in 2 week(s).   Specialty: Internal Medicine Contact information: [address]  [place] [address] [phone]        Hillis Range, MD .   Specialty: Cardiology Contact information: [address] [place] [address] [phone]        Laqueta Linden, MD .   Specialty: Cardiology Contact information: [address] [place] [address] [phone]            Contact information for after-discharge care    Destination    [facility]erred [facility] .   Service: Skilled Nursing Contact information: [address] [place] [address] [phone]                 Allergies  Allergen Reactions  . Amlodipine   . Lisinopril     Consultations:  None   Procedures/Studies: Ct Abdomen Pelvis Wo Contrast  Result Date: 05/01/2019 CLINICAL DATA:  85 year old with painful right inguinal hernia and abdominal distension. Altered mental status. EXAM: CT ABDOMEN AND PELVIS WITHOUT CONTRAST TECHNIQUE: Multidetector CT imaging of the abdomen and pelvis was performed following the standard protocol without IV contrast. COMPARISON:  CT 09/24/2016 FINDINGS: Lower chest: Small bilateral pleural effusions and compressive atelectasis. Pacemaker partially included. Dense coronary artery calcifications. Mild cardiomegaly. Pleural calcifications in the right hemithorax. Additional calcified granuloma. Left Bochdalek hernia contains fat. Hepatobiliary: Motion artifact. No focal abnormality. Clips in the gallbladder fossa postcholecystectomy. No biliary dilatation. Pancreas: Motion artifact. Parenchymal atrophy. No ductal  dilatation or inflammation. Spleen: Normal in size without focal abnormality. Adrenals/Urinary Tract: Normal adrenal glands. Bilateral perinephric edema. No hydronephrosis. Urinary bladder is decompressed by Foley catheter, there is marked urinary bladder wall thickening. Right aspect of the bladder extends towards the inguinal hernia. Stomach/Bowel: Large right inguinal hernia not entirely included in the field of view, hernia contains sigmoid colon without evidence of obstruction or wall thickening. Descending and sigmoid colonic diverticulosis without diverticulitis. Moderate diffuse colonic stool burden. Normal appendix. There is no small bowel obstruction or inflammation. Stomach is nondistended. Vascular/Lymphatic: Advanced aortic and branch atherosclerosis. No aneurysm. No bulky abdominopelvic adenopathy. Reproductive: Prominent prostate gland spans 5.3 cm. Other: Fat within the left inguinal canal, no bowel involvement. Large right inguinal hernia contains sigmoid colon. No ascites or free air. Musculoskeletal: Mild flattening of T10 vertebra. Bones are under mineralized. Multilevel degenerative change. There are no acute or suspicious osseous abnormalities. IMPRESSION: 1. Large right inguinal hernia containing sigmoid colon without obstruction or inflammation. Fat within the left inguinal canal without bowel involvement. 2. Marked urinary bladder wall thickening, bladder decompressed by Foley catheter. 3. Colonic diverticulosis without diverticulitis. 4. Advanced Aortic Atherosclerosis (ICD10-I70.0). 5. Small bilateral pleural effusions and adjacent compressive atelectasis. Electronically Signed  By: Narda RutherfordMelanie  Sanford M.D.   On: 05/01/2019 20:43   Dg Chest 1 View  Result Date: 05/01/2019 CLINICAL DATA:  Altered mental status for 2 days EXAM: CHEST  1 VIEW COMPARISON:  09/24/2016 FINDINGS: Cardiac shadow is enlarged. Pacing device is noted and stable. The lungs are well aerated bilaterally. Small left  pleural effusion is noted. No focal infiltrate is seen. Old rib fractures are noted. IMPRESSION: Small left pleural effusion.  No focal infiltrate is noted. Electronically Signed   By: Alcide CleverMark  Lukens M.D.   On: 05/01/2019 18:20   Ct Head Wo Contrast  Result Date: 05/01/2019 CLINICAL DATA:  Weakness and altered mental status for 2 days. EXAM: CT HEAD WITHOUT CONTRAST TECHNIQUE: Contiguous axial images were obtained from the base of the skull through the vertex without intravenous contrast. COMPARISON:  None. FINDINGS: Brain: No evidence of acute infarction, hemorrhage, hydrocephalus, extra-axial collection or mass lesion/mass effect. Moderate brain parenchymal volume loss and deep white matter microangiopathy. Vascular: Calcific atherosclerotic disease at the skull base. Skull: Normal. Negative for fracture or focal lesion. Sinuses/Orbits: No acute finding. Other: None. IMPRESSION: 1. No acute intracranial abnormality. 2. Atrophy, chronic microvascular disease. Electronically Signed   By: Ted Mcalpineobrinka  Dimitrova M.D.   On: 05/01/2019 18:48   Discharge Exam: Vitals:   05/04/19 2215 05/05/19 0543  BP: (!) 163/84 (!) 153/78  Pulse: 66 63  Resp: 17 17  Temp: 98.1 F (36.7 C) 98.1 F (36.7 C)  SpO2: 97% 96%   Vitals:   05/04/19 1349 05/04/19 1930 05/04/19 2215 05/05/19 0543  BP: (!) 144/98  (!) 163/84 (!) 153/78  Pulse: 63  66 63  Resp: 18  17 17   Temp: 98.3 F (36.8 C)  98.1 F (36.7 C) 98.1 F (36.7 C)  TempSrc: Oral     SpO2: 95% 96% 97% 96%  Weight:      Height:        General: Pt is alert, awake, not in acute distress Cardiovascular: RRR, S1/S2 +, no rubs, no gallops Respiratory: CTA bilaterally, no wheezing, no rhonchi Abdominal: Soft, NT, ND, bowel sounds + Extremities: no edema, no cyanosis    The results of significant diagnostics from this hospitalization (including imaging, microbiology, ancillary and laboratory) are listed below for reference.     Microbiology: Recent  Results (from the past 240 hour(s))  Urine Culture     Status: Abnormal   Collection Time: 05/01/19  4:38 PM   Specimen: Urine, Catheterized  Result Value Ref Range Status   Specimen Description   Final    URINE, CATHETERIZED Performed at [facility], [address] [place], [place] [address]    Special Requests   Final    Normal Performed at [facility], [address] [place], [place] [address]    Culture >=100,000 COLONIES/mL ESCHERICHIA COLI (A)  Final   Report Status 05/04/2019 FINAL  Final   Organism ID, Bacteria ESCHERICHIA COLI (A)  Final      Susceptibility   Escherichia coli - MIC*    AMPICILLIN >=32 RESISTANT Resistant     CEFAZOLIN <=4 SENSITIVE Sensitive     CEFTRIAXONE <=1 SENSITIVE Sensitive     CIPROFLOXACIN >=4 RESISTANT Resistant     GENTAMICIN <=1 SENSITIVE Sensitive     IMIPENEM <=0.25 SENSITIVE Sensitive     NITROFURANTOIN <=16 SENSITIVE Sensitive     TRIMETH/SULFA <=20 SENSITIVE Sensitive     AMPICILLIN/SULBACTAM 16 INTERMEDIATE Intermediate     PIP/TAZO <=4 SENSITIVE Sensitive     Extended  ESBL NEGATIVE Sensitive     * >=100,000 COLONIES/mL ESCHERICHIA COLI  SARS Coronavirus 2 (CEPHEID - Performed in [place] hospital lab), Hosp Order     Status: None   Collection Time: 05/01/19  6:39 PM   Specimen: Nasopharyngeal Swab  Result Value Ref Range Status   SARS Coronavirus 2 NEGATIVE NEGATIVE Final    Comment: (NOTE) If result is NEGATIVE SARS-CoV-2 target nucleic acids are NOT DETECTED. The SARS-CoV-2 RNA is generally detectable in upper and lower  respiratory specimens during the acute phase of infection. The lowest  concentration of SARS-CoV-2 viral copies this assay can detect is 250  copies / mL. A negative result does not preclude SARS-CoV-2 infection  and should not be used as the sole basis for treatment or other  patient management decisions.  A negative result may occur with  improper specimen collection / handling, submission of specimen  other  than nasopharyngeal swab, presence of viral mutation(s) within the  areas targeted by this assay, and inadequate number of viral copies  (<250 copies / mL). A negative result must be combined with clinical  observations, patient history, and epidemiological information. If result is POSITIVE SARS-CoV-2 target nucleic acids are DETECTED. The SARS-CoV-2 RNA is generally detectable in upper and lower  respiratory specimens dur ing the acute phase of infection.  Positive  results are indicative of active infection with SARS-CoV-2.  Clinical  correlation with patient history and other diagnostic information is  necessary to determine patient infection status.  Positive results do  not rule out bacterial infection or co-infection with other viruses. If result is PRESUMPTIVE POSTIVE SARS-CoV-2 nucleic acids MAY BE PRESENT.   A presumptive positive result was obtained on the submitted specimen  and confirmed on repeat testing.  While 2019 novel coronavirus  (SARS-CoV-2) nucleic acids may be present in the submitted sample  additional confirmatory testing may be necessary for epidemiological  and / or clinical management purposes  to differentiate between  SARS-CoV-2 and other Sarbecovirus currently known to infect humans.  If clinically indicated additional testing with an alternate test  methodology [phone]) is advised. The SARS-CoV-2 RNA is generally  detectable in upper and lower respiratory sp ecimens during the acute  phase of infection. The expected result is Negative. Fact Sheet for Patients:  StrictlyIdeas.no Fact Sheet for Healthcare Providers: BankingDealers.co.za This test is not yet approved or cleared by the [place] FDA and has been authorized for detection and/or diagnosis of SARS-CoV-2 by FDA under an Emergency Use Authorization (EUA).  This EUA will remain in effect (meaning this test can be used) for the duration  of the COVID-19 declaration under Section 564(b)(1) of the Act, 21 U.S.C. section 360bbb-3(b)(1), unless the authorization is terminated or revoked sooner. Performed at [facility], [address] [place], [place] [address]   MRSA PCR Screening     Status: None   Collection Time: 05/01/19  8:55 PM   Specimen: Nasal Mucosa; Nasopharyngeal  Result Value Ref Range Status   MRSA by PCR NEGATIVE NEGATIVE Final    Comment:        The GeneXpert MRSA Assay (FDA approved for NASAL specimens only), is one component of a comprehensive MRSA colonization surveillance program. It is not intended to diagnose MRSA infection nor to guide or monitor treatment for MRSA infections. Performed at [facility], [address] [place], [place] [address]      Labs: BNP (last 3 results) Recent Labs    05/01/19 1700  BNP 655.0*  Basic Metabolic Panel: Recent Labs  Lab 05/01/19 1700 05/02/19 0506 05/03/19 0436 05/04/19 0552 05/05/19 0406  NA 130* 131* 135 139 142  K 4.5 4.0 3.8 3.5 3.6  CL 94* 97* 103 101 107  CO2 25 21* 21* 22 22  GLUCOSE 112* 100* 110* 133* 148*  BUN 57* 57* 56* 51* 51*  CREATININE 2.18* 2.10* 1.86* 1.57* 1.43*  CALCIUM 8.3* 8.2* 8.0* 8.3* 8.5*   Liver Function Tests: Recent Labs  Lab 05/01/19 1700 05/02/19 0506 05/03/19 0436  AST 43* 38 31  ALT 33 28 27  ALKPHOS 142* 145* 160*  BILITOT 2.0* 1.9* 1.4*  PROT 6.0* 5.7* 5.6*  ALBUMIN 3.1* 2.9* 2.7*   No results for input(s): LIPASE, AMYLASE in the last 168 hours. Recent Labs  Lab 05/03/19 0721  AMMONIA 22   CBC: Recent Labs  Lab 05/01/19 1700 05/02/19 0506 05/03/19 0436 05/04/19 0552 05/05/19 0406  WBC 11.8* 12.8* 11.4* 14.3* 14.9*  NEUTROABS 10.4* 10.8* 9.0* 10.8*  --   HGB 10.5* 9.9* 10.0* 10.0* 10.6*  HCT 32.0* 29.8* 30.3* 30.5* 32.6*  MCV 101.3* 100.3* 99.7 101.0* 100.9*  PLT 141* 148* 135* 146* 160   Cardiac Enzymes: Recent Labs  Lab 05/02/19 0506  CKTOTAL 75   BNP: Invalid  input(s): POCBNP CBG: No results for input(s): GLUCAP in the last 168 hours. D-Dimer No results for input(s): DDIMER in the last 72 hours. Hgb A1c No results for input(s): HGBA1C in the last 72 hours. Lipid Profile No results for input(s): CHOL, HDL, LDLCALC, TRIG, CHOLHDL, LDLDIRECT in the last 72 hours. Thyroid function studies Recent Labs    05/03/19 0721  TSH 0.898   Anemia work up No results for input(s): VITAMINB12, FOLATE, FERRITIN, TIBC, IRON, RETICCTPCT in the last 72 hours. Urinalysis    Component Value Date/Time   COLORURINE AMBER (A) 05/01/2019 1635   APPEARANCEUR CLOUDY (A) 05/01/2019 1635   LABSPEC 1.011 05/01/2019 1635   PHURINE 5.0 05/01/2019 1635   GLUCOSEU NEGATIVE 05/01/2019 1635   HGBUR LARGE (A) 05/01/2019 1635   BILIRUBINUR NEGATIVE 05/01/2019 1635   KETONESUR NEGATIVE 05/01/2019 1635   PROTEINUR 100 (A) 05/01/2019 1635   NITRITE NEGATIVE 05/01/2019 1635   LEUKOCYTESUR LARGE (A) 05/01/2019 1635   Sepsis Labs Invalid input(s): PROCALCITONIN,  WBC,  LACTICIDVEN Microbiology Recent Results (from the past 240 hour(s))  Urine Culture     Status: Abnormal   Collection Time: 05/01/19  4:38 PM   Specimen: Urine, Catheterized  Result Value Ref Range Status   Specimen Description   Final    URINE, CATHETERIZED Performed at [facility], [address] [place], [place] [address]    Special Requests   Final    Normal Performed at [facility], [address] [place], [place] [address]    Culture >=100,000 COLONIES/mL ESCHERICHIA COLI (A)  Final   Report Status 05/04/2019 FINAL  Final   Organism ID, Bacteria ESCHERICHIA COLI (A)  Final      Susceptibility   Escherichia coli - MIC*    AMPICILLIN >=32 RESISTANT Resistant     CEFAZOLIN <=4 SENSITIVE Sensitive     CEFTRIAXONE <=1 SENSITIVE Sensitive     CIPROFLOXACIN >=4 RESISTANT Resistant     GENTAMICIN <=1 SENSITIVE Sensitive     IMIPENEM <=0.25 SENSITIVE Sensitive     NITROFURANTOIN <=16 SENSITIVE  Sensitive     TRIMETH/SULFA <=20 SENSITIVE Sensitive     AMPICILLIN/SULBACTAM 16 INTERMEDIATE Intermediate     PIP/TAZO <=4 SENSITIVE Sensitive  Extended ESBL NEGATIVE Sensitive     * >=100,000 COLONIES/mL ESCHERICHIA COLI  SARS Coronavirus 2 (CEPHEID - Performed in [facility] hospital lab), Hosp Order     Status: None   Collection Time: 05/01/19  6:39 PM   Specimen: Nasopharyngeal Swab  Result Value Ref Range Status   SARS Coronavirus 2 NEGATIVE NEGATIVE Final    Comment: (NOTE) If result is NEGATIVE SARS-CoV-2 target nucleic acids are NOT DETECTED. The SARS-CoV-2 RNA is generally detectable in upper and lower  respiratory specimens during the acute phase of infection. The lowest  concentration of SARS-CoV-2 viral copies this assay can detect is 250  copies / mL. A negative result does not preclude SARS-CoV-2 infection  and should not be used as the sole basis for treatment or other  patient management decisions.  A negative result may occur with  improper specimen collection / handling, submission of specimen other  than nasopharyngeal swab, presence of viral mutation(s) within the  areas targeted by this assay, and inadequate number of viral copies  (<250 copies / mL). A negative result must be combined with clinical  observations, patient history, and epidemiological information. If result is POSITIVE SARS-CoV-2 target nucleic acids are DETECTED. The SARS-CoV-2 RNA is generally detectable in upper and lower  respiratory specimens dur ing the acute phase of infection.  Positive  results are indicative of active infection with SARS-CoV-2.  Clinical  correlation with patient history and other diagnostic information is  necessary to determine patient infection status.  Positive results do  not rule out bacterial infection or co-infection with other viruses. If result is PRESUMPTIVE POSTIVE SARS-CoV-2 nucleic acids MAY BE PRESENT.   A presumptive positive result was obtained on  the submitted specimen  and confirmed on repeat testing.  While 2019 novel coronavirus  (SARS-CoV-2) nucleic acids may be present in the submitted sample  additional confirmatory testing may be necessary for epidemiological  and / or clinical management purposes  to differentiate between  SARS-CoV-2 and other Sarbecovirus currently known to infect humans.  If clinically indicated additional testing with an alternate test  methodology [phone]([id number]) is advised. The SARS-CoV-2 RNA is generally  detectable in upper and lower respiratory sp ecimens during the acute  phase of infection. The expected result is Negative. Fact Sheet for Patients:  BoilerBrush.com.cyhttps://www.fda.gov/media/136312/download Fact Sheet for Healthcare Providers: https://pope.com/https://www.fda.gov/media/136313/download This test is not yet approved or cleared by the [place] FDA and has been authorized for detection and/or diagnosis of SARS-CoV-2 by FDA under an Emergency Use Authorization (EUA).  This EUA will remain in effect (meaning this test can be used) for the duration of the COVID-19 declaration under Section 564(b)(1) of the Act, 21 U.S.C. section 360bbb-3(b)(1), unless the authorization is terminated or revoked sooner. Performed at [facility], [address] [place], [place] [address]   MRSA PCR Screening     Status: None   Collection Time: 05/01/19  8:55 PM   Specimen: Nasal Mucosa; Nasopharyngeal  Result Value Ref Range Status   MRSA by PCR NEGATIVE NEGATIVE Final    Comment:        The GeneXpert MRSA Assay (FDA approved for NASAL specimens only), is one component of a comprehensive MRSA colonization surveillance program. It is not intended to diagnose MRSA infection nor to guide or monitor treatment for MRSA infections. Performed at [facility], [address] [place], [place] [address]      Time coordinating discharge: 35 minutes  SIGNED:   Erick BlinksPratik D Sukanya Goldblatt, DO [facility]  05/05/2019, 10:20 AM  If  7PM-7AM, please contact night-coverage [email or web address] Password [email or web address]

## 2019-05-05 NOTE — Care Management Important Message (Signed)
Important Message  Patient Details  Name: Edwin [name] MRN: [id number] Date of Birth: [birth year]   Medicare Important Message Given:  Yes     Tommy Medal 05/05/2019, 12:58 PM

## 2019-05-06 NOTE — TOC Transition Note (Signed)
Transition of Care [facility]) - CM/SW Discharge Note   Patient Details  Name: Edwin [name] MRN: [id number] Date of Birth: 1920/11/16  Transition of [facility]) CM/SW Contact:  Latanya Maudlin, RN Phone Number: 05/06/2019, 10:41 AM   Clinical Narrative: Patient to be discharged per MD order. Orders in place for home health services. [facility] active with [facility], notified Melissa of discharge. Patient set to receive DME hospital bed and wheelchair today from [facility], per [facility] delivery is set between 12-2 pm.       Final next level of care: [place] Barriers to Discharge: No Barriers Identified   Patient Goals and CMS Choice Patient states their goals for this hospitalization and ongoing recovery are:: to get back to baseline CMS Medicare.gov Compare Post Acute Care list provided to:: Patient Represenative (must comment)(son, Randa Ngo) Choice offered to / list presented to : [facility] POA / Oletha Cruel)  Discharge Placement PASRR number recieved: 05/02/19                     Discharge Plan and Services In-house Referral: Clinical Social Work   Post Acute Care Choice: [place]          DME Arranged: Hospital bed DME Agency: [employer] Date DME Agency Contacted: 05/06/19 Time DME Agency Contacted: 57 Representative spoke with at DME Agency: [place]: RN, PT [place] Agency: [place] ([place]) Date [place]: 05/06/19 Time [place]: 29 Representative spoke with at [place]: [place] ([place]) Interventions     Readmission Risk Interventions No flowsheet data found.

## 2019-05-06 NOTE — Progress Notes (Signed)
D/C instructions reviewed with Chrisandra Netters via telephone, verbalized understanding. Patient to be transported to [address] [place], [place] [address] via [facility]. IV removed. D/C instructions and durable DNR placed in packet for family to receive.

## 2019-05-06 NOTE — Progress Notes (Signed)
Patient seen and evaluated at bedside this morning with no acute complaints or concerns noted overnight.  Please refer to discharge summary dictated on 05/05/2019.  Patient is stable for discharge and requires home equipment to be delivered prior to being able to go home, but should be able to do so today.

## 2019-05-12 ENCOUNTER — Encounter: Payer: Self-pay | Admitting: Cardiology

## 2019-05-12 NOTE — Progress Notes (Signed)
Remote pacemaker transmission.   

## 2019-05-24 ENCOUNTER — Telehealth: Payer: Self-pay | Admitting: *Deleted

## 2019-05-24 NOTE — Telephone Encounter (Signed)
Contacted r/e appt on 07/31 with [facility] and was informed by caregiver Hoyle Sauer Paschal) that all appointments should be canceled since patient was now being cared for by hospice. Upcoming appointment for 07/31 canceled.

## 2019-05-26 ENCOUNTER — Encounter: Payer: Medicare Other | Admitting: Internal Medicine

## 2019-05-27 DEATH — deceased

## 2019-08-18 IMAGING — CT CT ABDOMEN AND PELVIS WITHOUT CONTRAST
2 of 4 series · 16 of 46 positions shown, 18 images · non-contrast
Comparison: CT 09/24/2016

CLINICAL DATA: [AGE] with painful right inguinal hernia and
abdominal distension. Altered mental status.

EXAM:
CT ABDOMEN AND PELVIS WITHOUT CONTRAST
TECHNIQUE: Multidetector CT imaging of the abdomen and pelvis was performed
following the standard protocol without IV contrast.

[Series 3: axial st · axial · 0.88mm/px · z∈[-439,+11]mm · 13 of 104 slices shown, 15 images]
[im 7/104  soft-tissue]
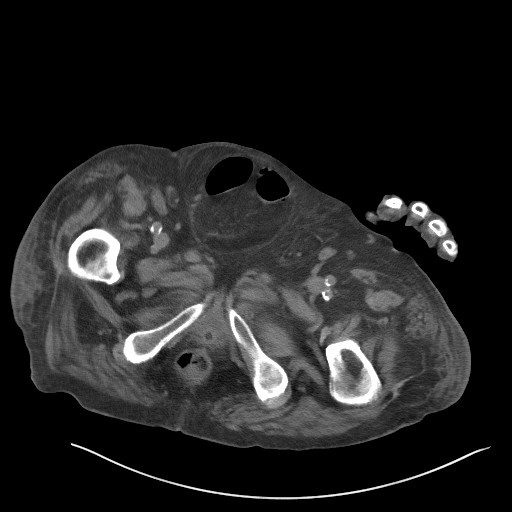
[im 7/104  bone]
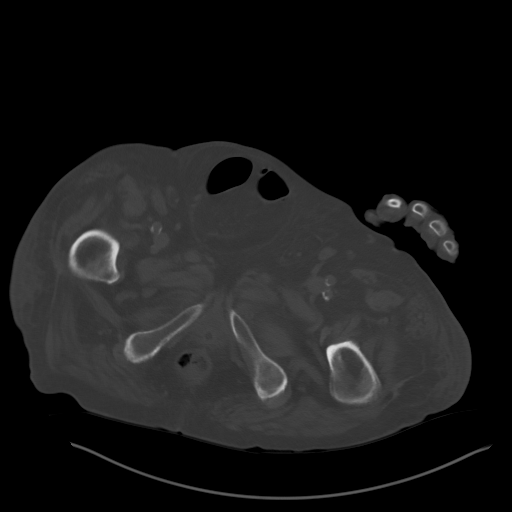
[im 13/104  soft-tissue]
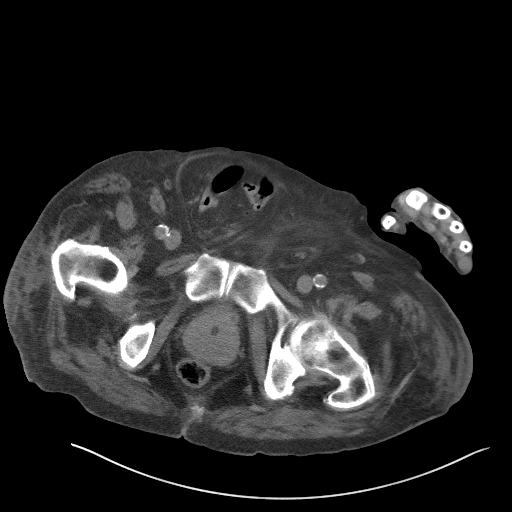
[im 25/104  soft-tissue]
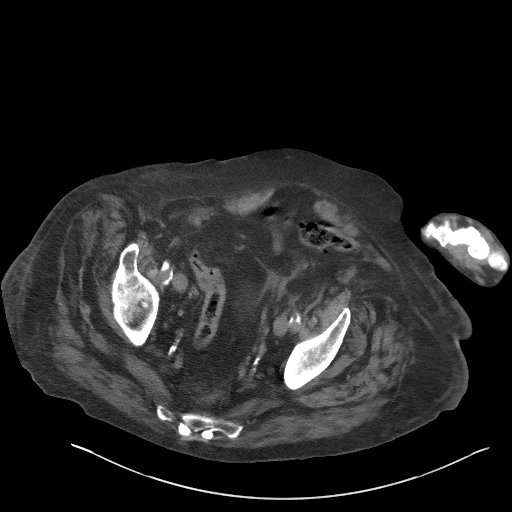
[im 31/104  soft-tissue]
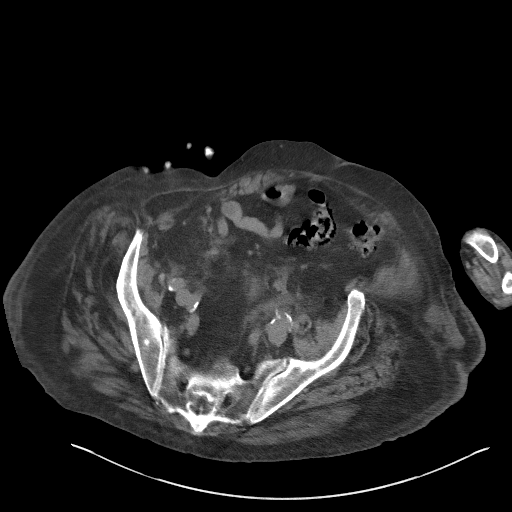
[im 37/104  soft-tissue]
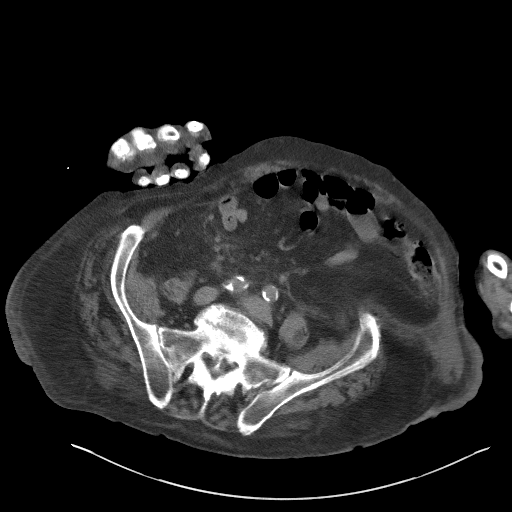
[im 43/104  soft-tissue]
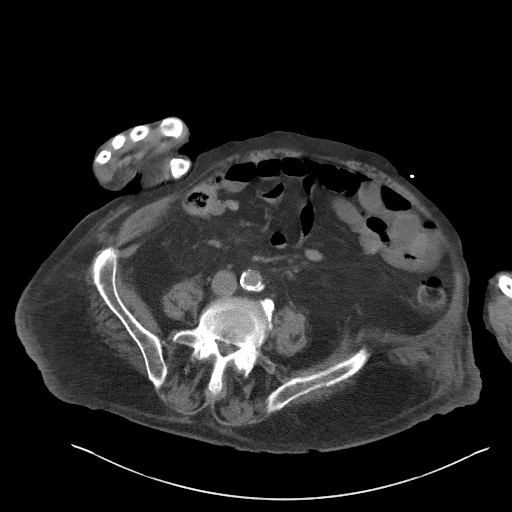
[im 55/104  soft-tissue]
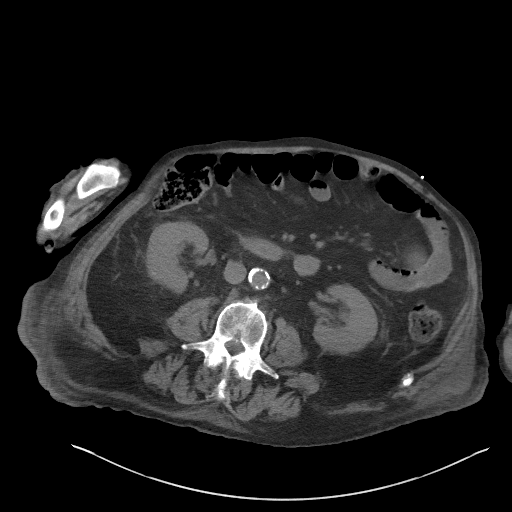
[im 61/104  soft-tissue]
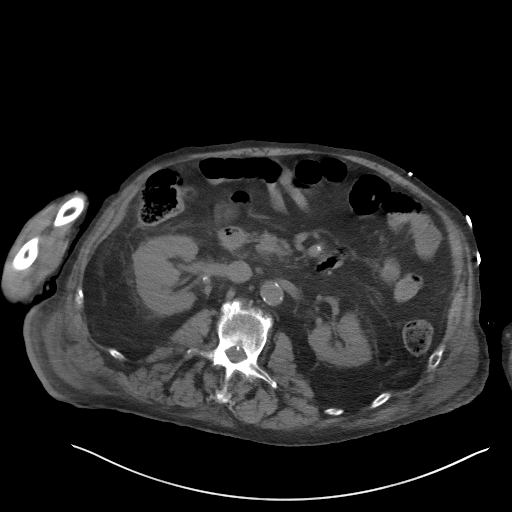
[im 67/104  soft-tissue]
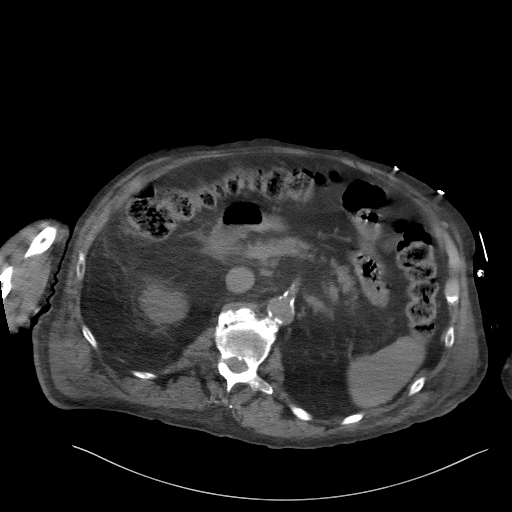
[im 67/104  bone]
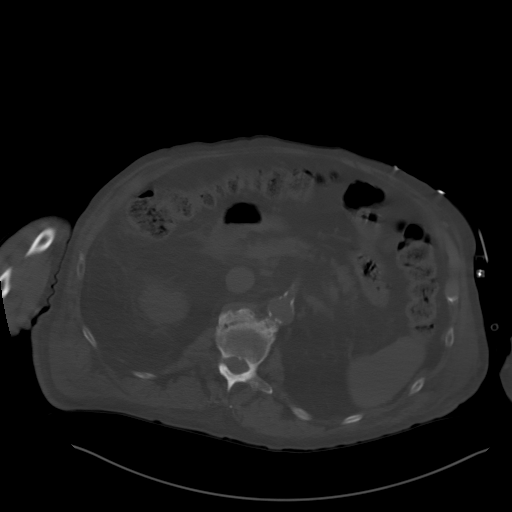
[im 73/104  soft-tissue]
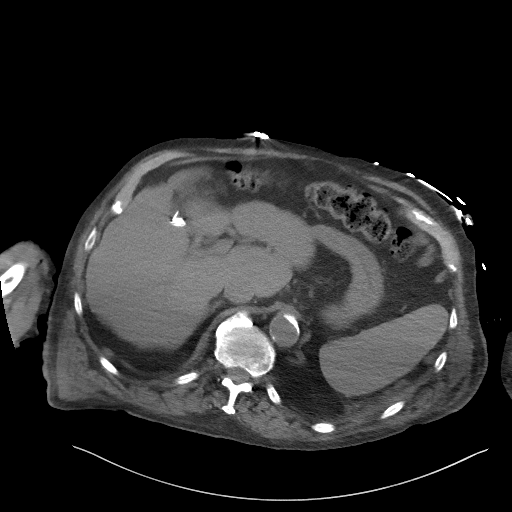
[im 79/104  soft-tissue]
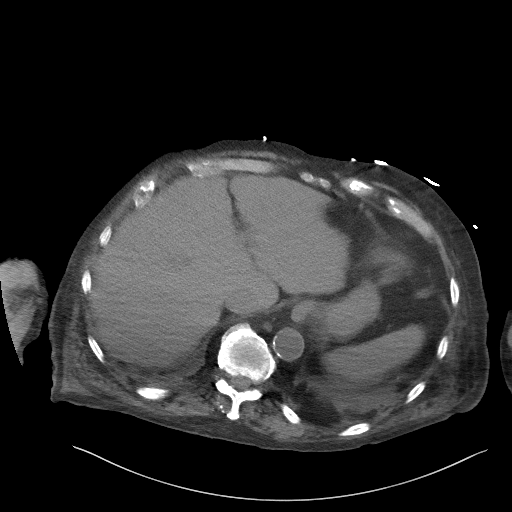
[im 91/104  soft-tissue]
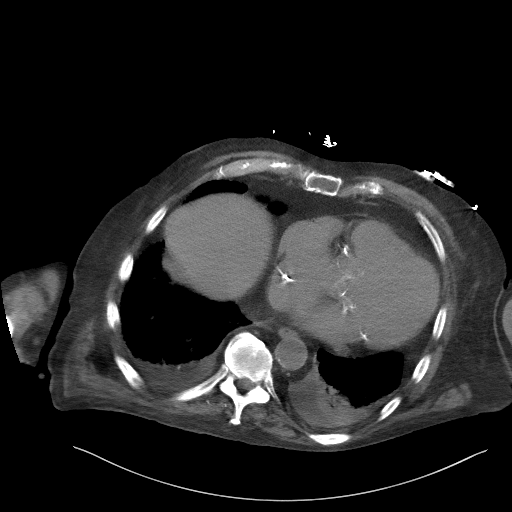
[im 97/104  soft-tissue]
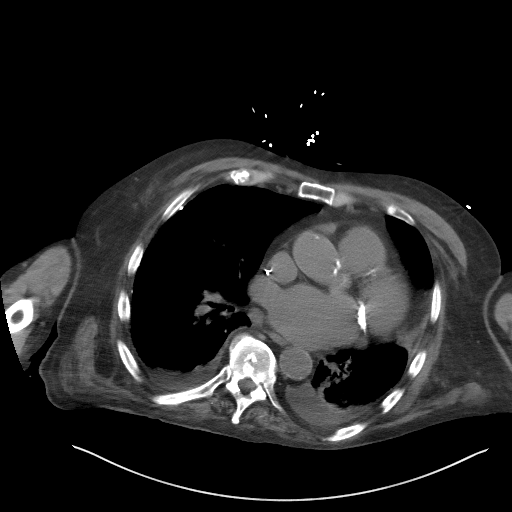

[Series 6: coronal st · coronal · 0.99mm/px · 3 of 109 slices shown]
[im 37/109  soft-tissue]
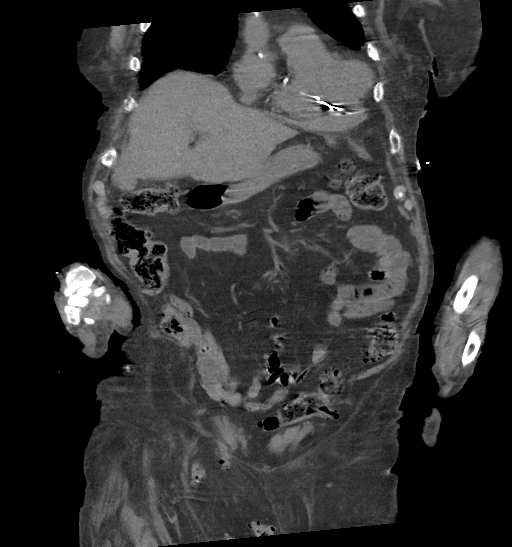
[im 49/109  soft-tissue]
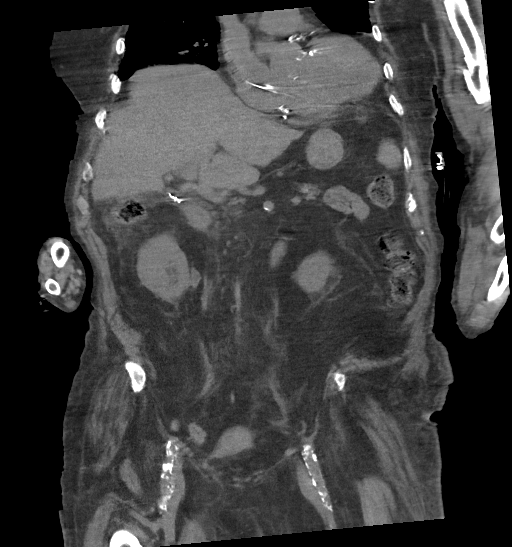
[im 61/109  soft-tissue]
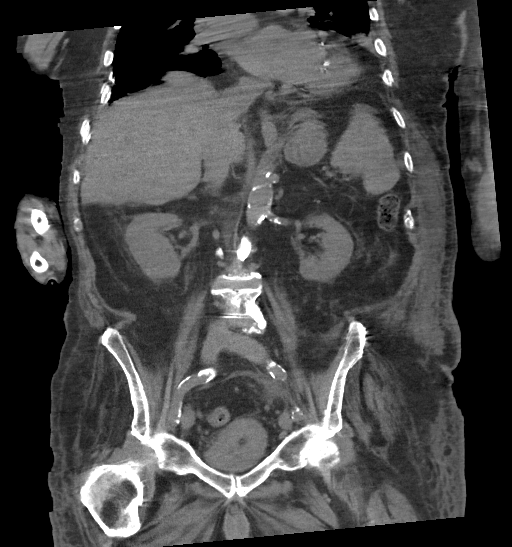

[16 of 46 positions shown; findings below may reference images not displayed]

FINDINGS: Lower chest: Small bilateral pleural effusions and compressive
atelectasis. Pacemaker partially included. Dense coronary artery
calcifications. Mild cardiomegaly. Pleural calcifications in the
right hemithorax. Additional calcified granuloma. Left [place]
hernia contains fat.

Hepatobiliary: Motion artifact. No focal abnormality. Clips in the
gallbladder fossa postcholecystectomy. No biliary dilatation.

Pancreas: Motion artifact. Parenchymal atrophy. No ductal dilatation
or inflammation.

Spleen: Normal in size without focal abnormality.

Adrenals/Urinary Tract: Normal adrenal glands. Bilateral perinephric
edema. No hydronephrosis. Urinary bladder is decompressed by Foley
catheter, there is marked urinary bladder wall thickening. Right
aspect of the bladder extends towards the inguinal hernia.

Stomach/Bowel: Large right inguinal hernia not entirely included in
the field of view, hernia contains sigmoid colon without evidence of
obstruction or wall thickening. Descending and sigmoid colonic
diverticulosis without diverticulitis. Moderate diffuse colonic
stool burden. Normal appendix. There is no small bowel obstruction
or inflammation. Stomach is nondistended.

Vascular/Lymphatic: Advanced aortic and branch atherosclerosis. No
aneurysm. No bulky abdominopelvic adenopathy.

Reproductive: Prominent prostate gland spans 5.3 cm.

Other: Fat within the left inguinal canal, no bowel involvement.
Large right inguinal hernia contains sigmoid colon. No ascites or
free air.

Musculoskeletal: Mild flattening of T10 vertebra. Bones are under
mineralized. Multilevel degenerative change. There are no acute or
suspicious osseous abnormalities.
IMPRESSION: 1. Large right inguinal hernia containing sigmoid colon without
obstruction or inflammation. Fat within the left inguinal canal
without bowel involvement.
2. Marked urinary bladder wall thickening, bladder decompressed by
Foley catheter.
3. Colonic diverticulosis without diverticulitis.
4. Advanced Aortic Atherosclerosis ([id number]).
5. Small bilateral pleural effusions and adjacent compressive
atelectasis.

## 2019-08-18 IMAGING — CT CT HEAD WITHOUT CONTRAST
4 series · 17 of 47 positions shown, 19 images · non-contrast
Comparison: None.

CLINICAL DATA: Weakness and altered mental status for 2 days.

EXAM:
CT HEAD WITHOUT CONTRAST
TECHNIQUE: Contiguous axial images were obtained from the base of the skull
through the vertex without intravenous contrast.

[Series 2: head w o · axial · 0.48mm/px · z∈[+1004,+1124]mm · 7 of 33 slices shown, 9 images]
[im 5/33  brain]
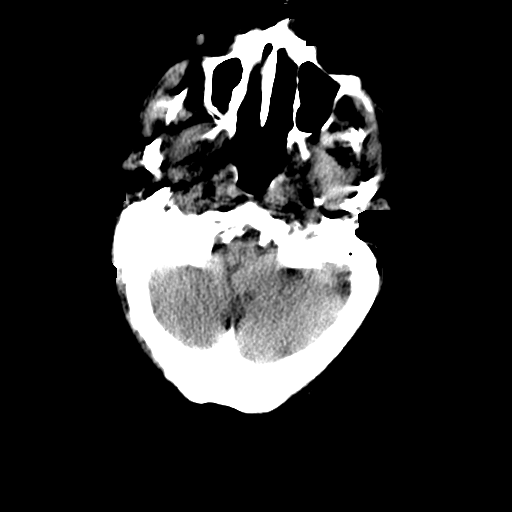
[im 5/33  bone]
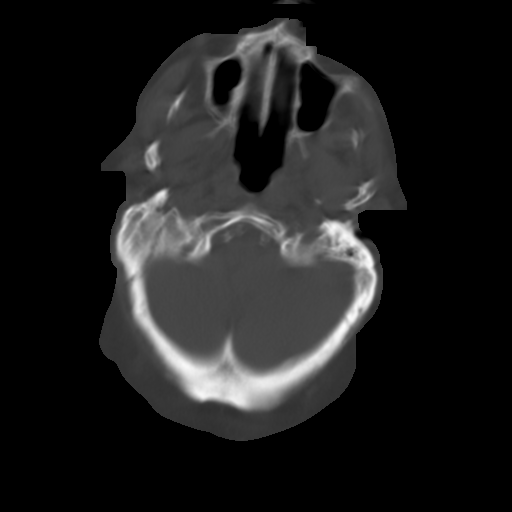
[im 9/33  brain]
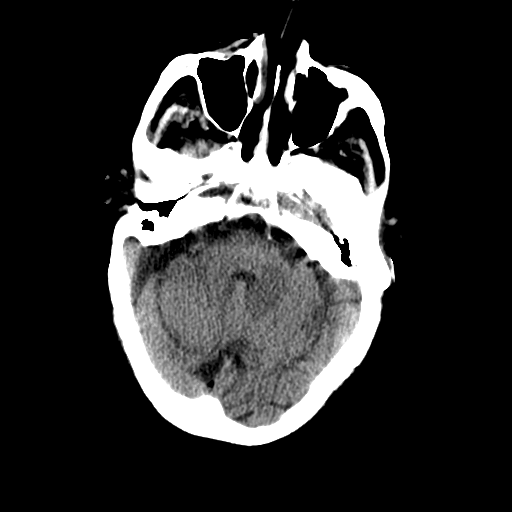
[im 13/33  brain]
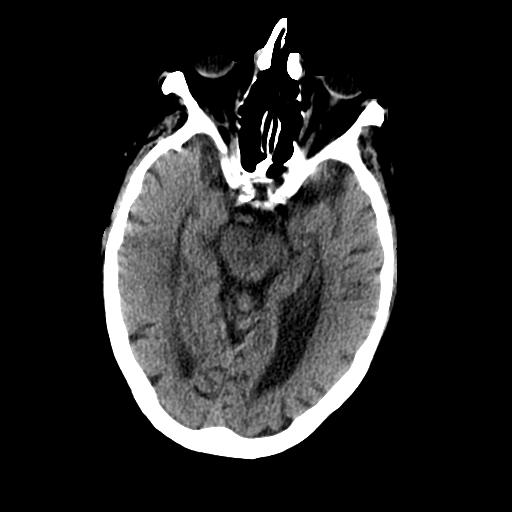
[im 17/33  brain]
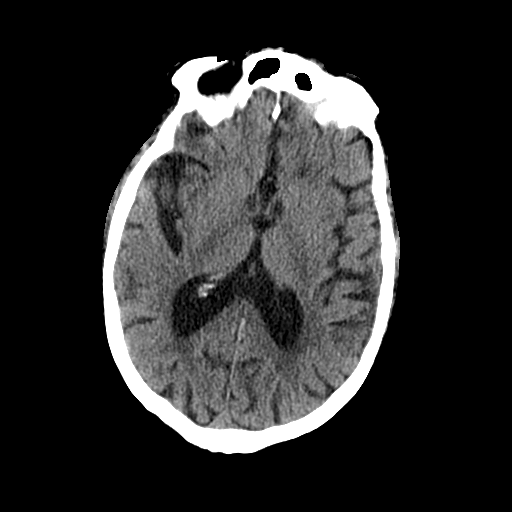
[im 21/33  brain]
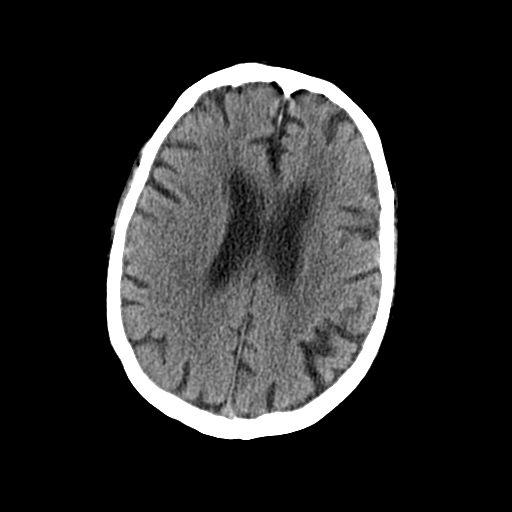
[im 21/33  bone]
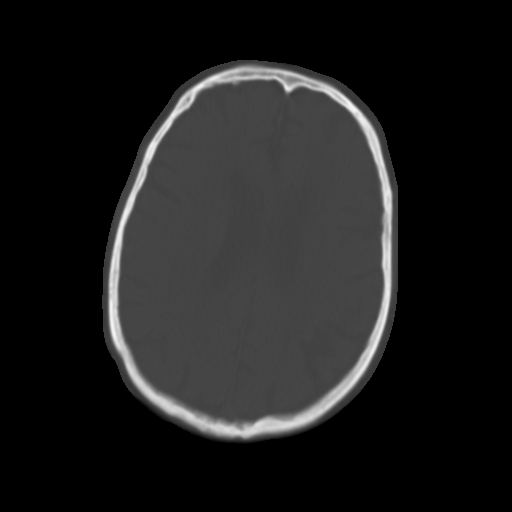
[im 25/33  brain]
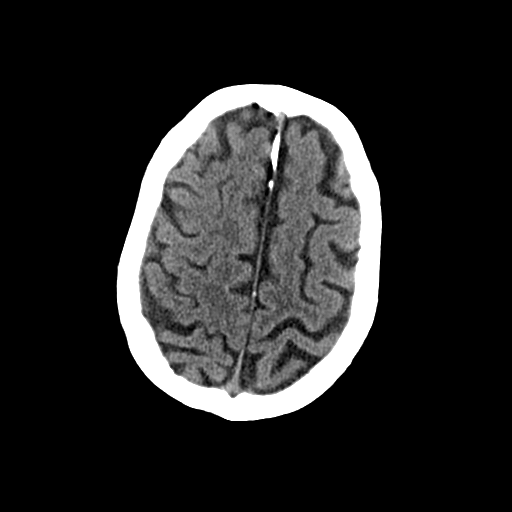
[im 29/33  brain]
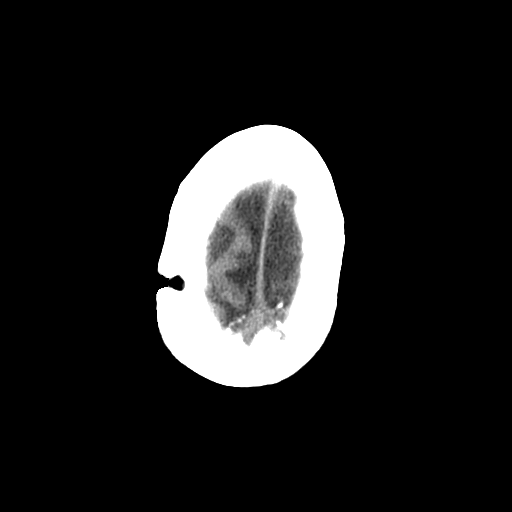

[Series 3: head bone · axial · 0.48mm/px · z∈[+1000,+1056]mm · 4 of 83 slices shown]
[im 9/83  bone]
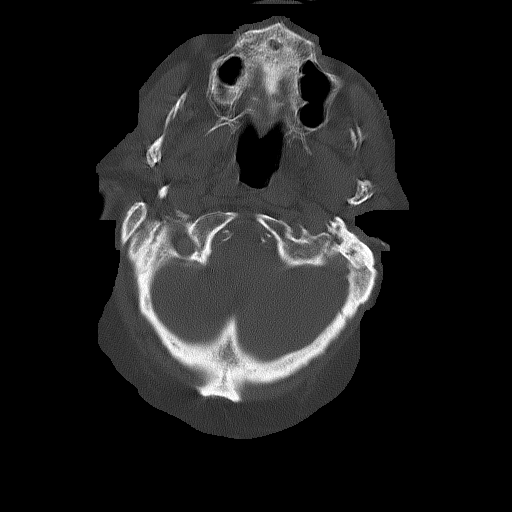
[im 17/83  bone]
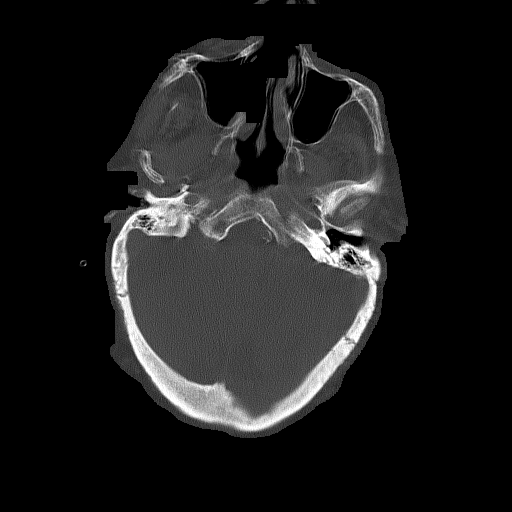
[im 25/83  bone]
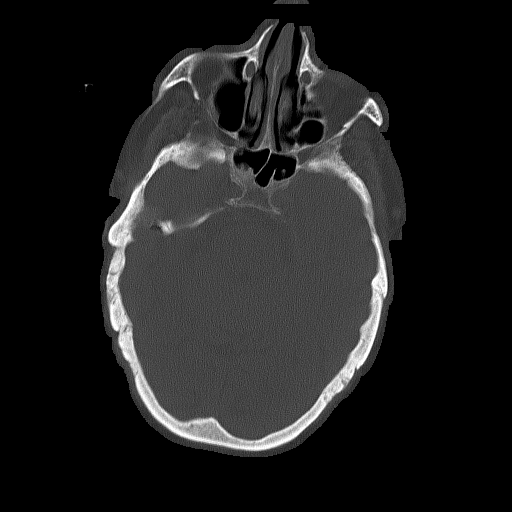
[im 37/83  bone]
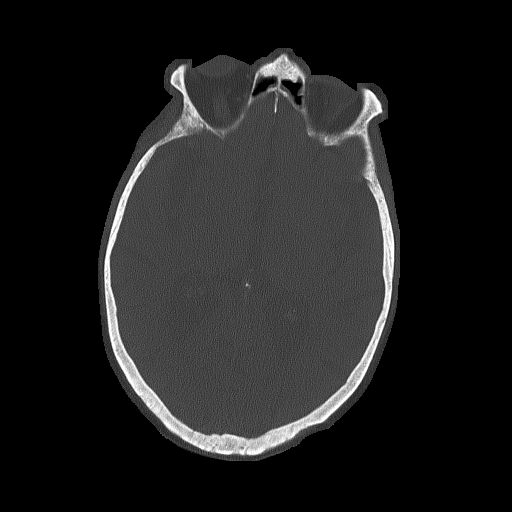

[Series 4: coronal soft · coronal · 0.35mm/px · 3 of 78 slices shown]
[im 26/78  brain]
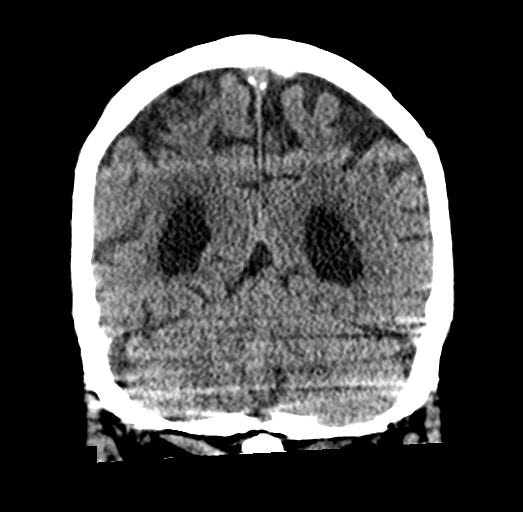
[im 35/78  brain]
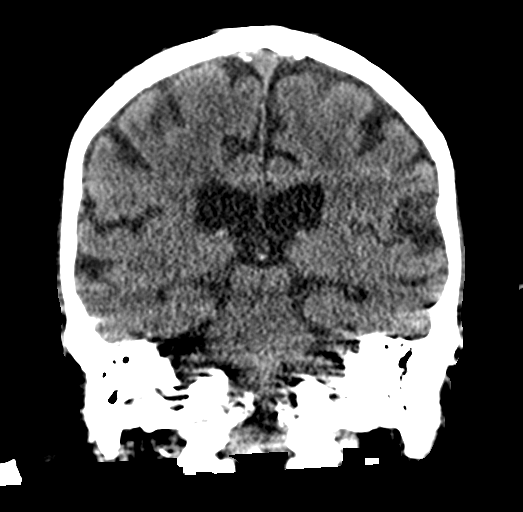
[im 43/78  brain]
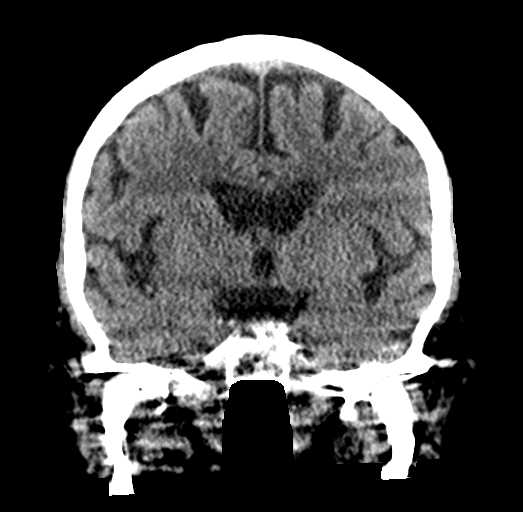

[Series 5: sagittal soft · sagittal · 0.37mm/px · 3 of 61 slices shown]
[im 21/61  brain]
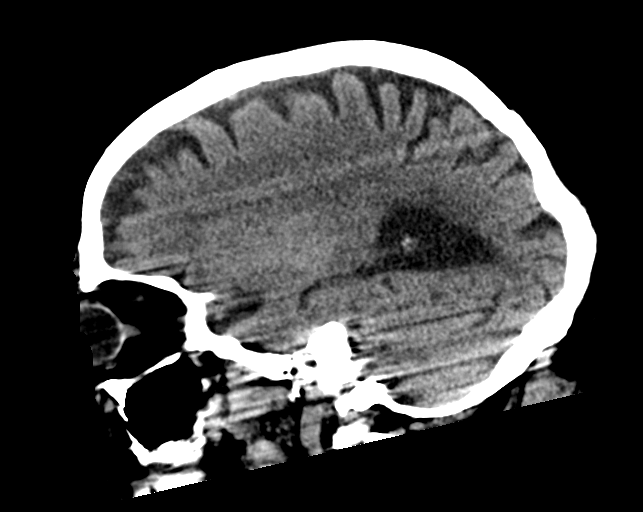
[im 31/61  brain]
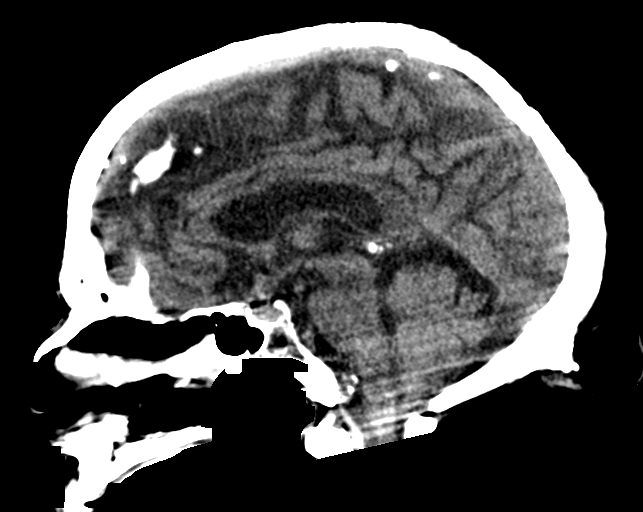
[im 41/61  brain]
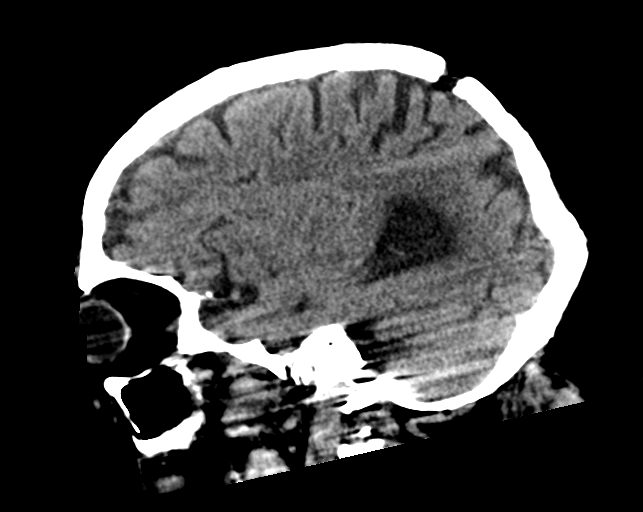

[17 of 47 positions shown; findings below may reference images not displayed]

FINDINGS: Brain: No evidence of acute infarction, hemorrhage, hydrocephalus,
extra-axial collection or mass lesion/mass effect. Moderate brain
parenchymal volume loss and deep white matter microangiopathy.

Vascular: Calcific atherosclerotic disease at the skull base.

Skull: Normal. Negative for fracture or focal lesion.

Sinuses/Orbits: No acute finding.

Other: None.
IMPRESSION: 1. No acute intracranial abnormality.
2. Atrophy, chronic microvascular disease.
